# Patient Record
Sex: Male | Born: 1995 | Race: White | Hispanic: No | Marital: Single | Smoking: Never smoker
Health system: Southern US, Community
[De-identification: ages and names within clinical notes are randomized; demographics above are authoritative.]

## PROBLEM LIST (undated history)

## (undated) DIAGNOSIS — G4730 Sleep apnea, unspecified: Secondary | ICD-10-CM

## (undated) DIAGNOSIS — Q909 Down syndrome, unspecified: Secondary | ICD-10-CM

## (undated) DIAGNOSIS — R7303 Prediabetes: Secondary | ICD-10-CM

## (undated) DIAGNOSIS — J45909 Unspecified asthma, uncomplicated: Secondary | ICD-10-CM

## (undated) HISTORY — PX: TONSILLECTOMY: SUR1361

---
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
## 2016-01-31 ENCOUNTER — Ambulatory Visit: Payer: Medicaid Other

## 2016-02-02 ENCOUNTER — Ambulatory Visit
Admission: RE | Admit: 2016-02-02 | Discharge: 2016-02-02 | Disposition: A | Discharge status: Home / Self Care | Payer: Medicaid Other | Source: Ambulatory Visit | Attending: Pediatrics | Admitting: Pediatrics

## 2016-02-02 DIAGNOSIS — R231 Pallor: Secondary | ICD-10-CM | POA: Diagnosis present

## 2016-02-02 DIAGNOSIS — Q909 Down syndrome, unspecified: Secondary | ICD-10-CM | POA: Insufficient documentation

## 2016-02-02 DIAGNOSIS — R42 Dizziness and giddiness: Secondary | ICD-10-CM | POA: Diagnosis present

## 2016-12-14 ENCOUNTER — Encounter: Payer: Self-pay | Admitting: Emergency Medicine

## 2016-12-14 ENCOUNTER — Emergency Department: Payer: Medicaid Other

## 2016-12-14 ENCOUNTER — Emergency Department
Admission: EM | Admit: 2016-12-14 | Discharge: 2016-12-14 | Disposition: A | Discharge status: Home / Self Care | Payer: Medicaid Other | Attending: Emergency Medicine | Admitting: Emergency Medicine

## 2016-12-14 DIAGNOSIS — K5900 Constipation, unspecified: Secondary | ICD-10-CM | POA: Diagnosis not present

## 2016-12-14 DIAGNOSIS — R109 Unspecified abdominal pain: Secondary | ICD-10-CM

## 2016-12-14 DIAGNOSIS — J45909 Unspecified asthma, uncomplicated: Secondary | ICD-10-CM | POA: Diagnosis not present

## 2016-12-14 DIAGNOSIS — Z79899 Other long term (current) drug therapy: Secondary | ICD-10-CM | POA: Insufficient documentation

## 2016-12-14 HISTORY — DX: Unspecified asthma, uncomplicated: J45.909

## 2016-12-14 HISTORY — DX: Sleep apnea, unspecified: G47.30

## 2016-12-14 NOTE — ED Notes (Signed)
Pt tolerated half of the soap suds enema approx 250 cc

## 2016-12-14 NOTE — ED Notes (Signed)
Spoke with Dr. Pershing ProudSchaevitz in regards to patient presentation. No new orders at this time. Verbal order for patient to go to flex.

## 2016-12-14 NOTE — ED Notes (Signed)
First nurse note  [facility]  Constipation for 3 days

## 2016-12-14 NOTE — ED Provider Notes (Signed)
[facility] Emergency Department Provider Note  ____________________________________________   First MD Initiated Contact with Patient 12/14/16 1735     (approximate)  I have reviewed the triage vital signs and the nursing notes.   HISTORY  Chief Complaint Constipation    HPI Jeffrey Hodge is a 21 y.o. male who is accompanied here by his parents, he has Down syndrome and is a poor historian, the parents state that he keeps complaining of abdominal pain, states it started at the bellybutton and has moved to the lower abdomen, he has been constipated since Sunday, she states he will get a little bit of a bowel movement but it is watery and just on the toilet paper, she states he is crying when on the toilet, she states he has a high pain tolerance, she states he has not been constipated like this and they are concerned as he has a ventral hernia had a recent repair, they deny vomiting, fever, chest pain, or shortness of breath  Past Medical History:  Diagnosis Date  . Asthma   . Sleep apnea     Patient Active Problem List   Diagnosis Date Noted  . Difficulty hearing 05/20/2014  . Apnea, sleep 05/20/2014  . Disorder of vision 05/20/2014  . 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 Hydronephrosis 07/01/2013  . Allergic rhinitis 01/07/2013  . Adiposity 01/07/2013  . 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 TONSILLECTOMY      Prior to Admission medications   Medication Sig Start Date End Date Taking? Authorizing Provider  cetirizine (ZYRTEC) 1 MG/ML syrup  02/17/14   [provider]  DENTA 5000 PLUS 1.1 % CREA dental cream  05/06/14   [provider]  doxazosin (CARDURA) 1 MG tablet  04/01/14   [provider]  fluticasone Aleda Grana(FLONASE) 50 MCG/ACT nasal spray  04/26/14   [provider]  LORATADINE CHILDRENS  5 MG/5ML syrup  05/04/14   [provider]  prednisoLONE (ORAPRED) 15 MG/5ML solution  05/05/14   [provider]  QVAR 40 MCG/ACT inhaler  05/06/14   [provider]  triamcinolone (KENALOG) 0.025 % cream  04/10/14   [provider]  triamcinolone cream (KENALOG) 0.1 %  02/28/14   [provider]    Allergies Meningococcal vaccines  No family history on file.  Social History Social History   Tobacco Use  . Smoking status: Never Smoker  . Smokeless tobacco: Never Used  Substance Use Topics  . Alcohol use: No    Frequency: Never  . Drug use: No    Review of Systems  Constitutional: No fever/chills Eyes: No visual changes. ENT: No sore throat. Respiratory: Denies cough ABD: Positive for abdominal pain and constipation Genitourinary: Negative for dysuria. Musculoskeletal: Negative for back pain. Skin: Negative for rash.    ____________________________________________   PHYSICAL EXAM:  VITAL SIGNS: ED Triage Vitals [12/14/16 1702]  Enc Vitals Group     BP (!) 141/99     Pulse Rate 70     Resp 16     Temp 98.6 F (37 C)     Temp Source Oral     SpO2 98 %     Weight 250 lb (113.4 kg)     Height      Head Circumference      Peak Flow      Pain Score  Pain Loc      Pain Edu?      Excl. in GC?     Constitutional: Alert and oriented. Well appearing and in no acute distress. Eyes: Conjunctivae are normal.  Head: Atraumatic. Nose: No congestion/rhinnorhea. Mouth/Throat: Mucous membranes are moist.   Cardiovascular: Normal rate, regular rhythm.  Heart sounds are normal Respiratory: Normal respiratory effort.  No retractions, lungs are clear to auscultation ABD: Abdomen is soft, tender in the right lower quadrant, bowel sounds are decreased but present, there is no rebound tenderness GU: deferred Musculoskeletal: FROM all extremities, warm and well perfused Neurologic:  Normal speech and language.  Skin:  Skin is warm,  dry and intact. No rash noted. Psychiatric: Mood and affect are normal. Speech and behavior are normal.  ____________________________________________   LABS (all labs ordered are listed, but only abnormal results are displayed)  Labs Reviewed - No data to display ____________________________________________   ____________________________________________  RADIOLOGY  KUB x-ray was ordered  ____________________________________________   PROCEDURES  Procedure(s) performed: No      ____________________________________________   INITIAL IMPRESSION / ASSESSMENT AND PLAN / ED COURSE  Pertinent labs & imaging results that were available during my care of the patient were reviewed by me and considered in my medical decision making (see chart for details).  Patient is complaining of abdominal pain, states he has been constipated for several days, he can get small amount of bowel movement out but it is liquidy on the toilet paper, on exam the right lower quadrant is very tender, concerns of acute appendicitis, the patient was transferred over to the C pod due to concerns of appendicitis or impaction      ____________________________________________   FINAL CLINICAL IMPRESSION(S) / ED DIAGNOSES  Final diagnoses:  None      NEW MEDICATIONS STARTED DURING THIS VISIT:  This SmartLink is deprecated. Use AVSMEDLIST instead to display the medication list for a patient.   Note:  This document was prepared using Dragon voice recognition software and may include unintentional dictation errors.    Faythe GheeFisher, Simar Pothier W, PA-C 12/14/16 1827    Schaevitz, Myra Rudeavid Matthew, MD 12/14/16 [phone]

## 2016-12-14 NOTE — ED Notes (Signed)
Pt had emesis while attempting to defecate. Still no stool results.

## 2016-12-14 NOTE — ED Notes (Signed)
Pt anxious to left - declined vital signs

## 2016-12-14 NOTE — ED Notes (Signed)
Still no stool results. Ct of abd added.

## 2016-12-14 NOTE — ED Notes (Signed)
Enema performed by Darl PikesSusan, RN and assisted by this nurse.

## 2016-12-14 NOTE — ED Provider Notes (Signed)
[facility] Emergency Department Provider Note  Time seen: 8:03 PM  I have reviewed the triage vital signs and the nursing notes.  Patient was moved from flex care to CDU.  The patient has been seen, interviewed and evaluated by myself.   HISTORY  Chief Complaint Constipation    HPI Jeffrey Hodge is a 21 y.o. male with a past medical history of Down syndrome, asthma, presents to the emergency department for abdominal discomfort.  According to mom the patient has been intermittently complaining of abdominal discomfort over the past few days.  States he has not had a bowel movement in approximately 4 or 5 days, and normally has a bowel movement every day.  States at times he feels like he needs to have a bowel movement and will go to the bathroom but is not able to produce a bowel movement.  No reported dysuria.  No fever.  No vomiting or diarrhea. Past Medical History:  Diagnosis Date  . Asthma   . Sleep apnea     Patient Active Problem List   Diagnosis Date Noted  . Difficulty hearing 05/20/2014  . Apnea, sleep 05/20/2014  . Disorder of vision 05/20/2014  . 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 Hydronephrosis 07/01/2013  . Allergic rhinitis 01/07/2013  . Adiposity 01/07/2013  . 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 TONSILLECTOMY      Prior to Admission medications   Medication Sig Start Date End Date Taking? Authorizing Provider  cetirizine (ZYRTEC) 1 MG/ML syrup  02/17/14   [provider]  DENTA 5000 PLUS 1.1 % CREA dental cream  05/06/14   [provider]  doxazosin (CARDURA) 1 MG tablet  04/01/14   [provider]  fluticasone Aleda Grana(FLONASE) 50 MCG/ACT nasal spray  04/26/14   [provider]  LORATADINE CHILDRENS 5 MG/5ML syrup  05/04/14   [provider]  prednisoLONE (ORAPRED) 15 MG/5ML  solution  05/05/14   [provider]  QVAR 40 MCG/ACT inhaler  05/06/14   [provider]  triamcinolone (KENALOG) 0.025 % cream  04/10/14   [provider]  triamcinolone cream (KENALOG) 0.1 %  02/28/14   [provider]    Allergies  Allergen Reactions  . Meningococcal Vaccines Hives    No family history on file.  Social History Social History   Tobacco Use  . Smoking status: Never Smoker  . Smokeless tobacco: Never Used  Substance Use Topics  . Alcohol use: No    Frequency: Never  . Drug use: No    Review of Systems Constitutional: Negative for fever. Cardiovascular: Negative for chest pain. Respiratory: Negative for shortness of breath. Gastrointestinal: Positive for intermittent abdominal discomfort and constipation.  Denies any abdominal pain currently. Genitourinary: Negative for dysuria. All other ROS negative  ____________________________________________   PHYSICAL EXAM:  VITAL SIGNS: ED Triage Vitals [12/14/16 1702]  Enc Vitals Group     BP (!) 141/99     Pulse Rate 70     Resp 16     Temp 98.6 F (37 C)     Temp Source Oral     SpO2 98 %     Weight 250 lb (113.4 kg)     Height      Head Circumference      Peak Flow      Pain Score      Pain Loc  Pain Edu?      Excl. in GC?    Constitutional: Alert. Well appearing and in no distress. Eyes: Normal exam ENT   Head: Normocephalic and atraumatic.   Mouth/Throat: Mucous membranes are moist. Cardiovascular: Normal rate, regular rhythm. No murmur Respiratory: Normal respiratory effort without tachypnea nor retractions. Breath sounds are clear Gastrointestinal: Soft and nontender. No distention.  No reaction to abdominal palpation. Musculoskeletal: Nontender with normal range of motion in all extremities. Neurologic:  Normal speech and language. No gross focal neurologic deficits  Skin:  Skin is warm, dry and intact.  Psychiatric: Mood and affect are  normal.    RADIOLOGY  X-ray shows large volume of stool.  No obstructive gas pattern.  No perforation.  CT scan shows no acute abnormality.  ____________________________________________   INITIAL IMPRESSION / ASSESSMENT AND PLAN / ED COURSE  Pertinent labs & imaging results that were available during my care of the patient were reviewed by me and considered in my medical decision making (see chart for details).  Patient presents to the emergency department with reported intermittent abdominal discomfort and constipation per mom.  Currently the patient denies any abdominal pain.  Has a completely nontender exam.  X-ray shows a large volume of colonic stool but no obstructive gas pattern.  Differential would include small bowel obstruction, constipation, fecal impaction, less likely appendicitis.  Overall the patient appears very well.  Vitals are normal.  Normal abdominal exam.  Given the x-ray showing a large volume of stool we will attempt an enema for symptom relief.  If the patient is able to have a bowel movement we will likely discharge home, if not the patient may require further workup such as CT imaging.  No relief after first half of enema.  I performed a digital rectal exam the patient does have hard stool in the rectal vault I was able to break up some of the hard stool, but it is difficult given habitus and patient cooperation.  We will attempt the second half of the enema continue to closely monitor for relief.  No significant bowel movement after the second half of the enema.  Parents state this is the patient's first time he has ever been constipated in his life and is extremely abnormal for the patient.  Denies any vomiting.  Denies any fever.  They are agreeable to stay for a noncontrasted CT scan to help rule out obstructive process.  Otherwise I discussed a home MiraLAX bowel prep.  CT scan is read as largely negative.  There is one area of bowel thickening that I was  concerned about when I reviewed the CT I discussed this with the radiologist who states it is most likely just decompressed bowel.  Patient continues to appear extremely well in the emergency department.  No signs of obstruction on the CT scan.  I discussed using MiraLAX at home for symptom/constipation relief.  I also discussed return precautions for any complaints of abdominal pain or development of fever.  They are agreeable to this plan of care.  ____________________________________________   FINAL CLINICAL IMPRESSION(S) / ED DIAGNOSES  Constipation Abdominal pain    Minna AntisPaduchowski, Alanie Syler, MD 12/14/16 2309

## 2016-12-14 NOTE — ED Triage Notes (Signed)
Patient presents to ED via POV from home with c/o constipation since Sunday. Patient reports umbilical pain. Patient is developmentally delayed, history is obtained from family.

## 2016-12-14 NOTE — ED Notes (Signed)
Dr and nurse in with pt - dr attempting to disimpact, was able to break up some stool but unable to remove any. Rest of soap suds administered.

## 2019-03-22 ENCOUNTER — Ambulatory Visit: Payer: Medicaid Other

## 2020-06-20 ENCOUNTER — Emergency Department (HOSPITAL_COMMUNITY): Payer: Medicaid Other

## 2020-06-20 ENCOUNTER — Encounter (HOSPITAL_COMMUNITY): Payer: Self-pay

## 2020-06-20 ENCOUNTER — Encounter: Payer: Self-pay | Admitting: Emergency Medicine

## 2020-06-20 ENCOUNTER — Other Ambulatory Visit: Payer: Self-pay

## 2020-06-20 ENCOUNTER — Observation Stay (HOSPITAL_COMMUNITY)
Admission: EM | Admit: 2020-06-20 | Discharge: 2020-06-21 | Disposition: A | Discharge status: Home / Self Care | Payer: Medicaid Other | Attending: Orthopedic Surgery | Admitting: Orthopedic Surgery

## 2020-06-20 DIAGNOSIS — W540XXA Bitten by dog, initial encounter: Secondary | ICD-10-CM | POA: Diagnosis not present

## 2020-06-20 DIAGNOSIS — S60922A Unspecified superficial injury of left hand, initial encounter: Secondary | ICD-10-CM | POA: Diagnosis present

## 2020-06-20 DIAGNOSIS — J45909 Unspecified asthma, uncomplicated: Secondary | ICD-10-CM | POA: Diagnosis not present

## 2020-06-20 DIAGNOSIS — Z20822 Contact with and (suspected) exposure to covid-19: Secondary | ICD-10-CM | POA: Insufficient documentation

## 2020-06-20 DIAGNOSIS — S61452A Open bite of left hand, initial encounter: Principal | ICD-10-CM | POA: Insufficient documentation

## 2020-06-20 DIAGNOSIS — Q909 Down syndrome, unspecified: Secondary | ICD-10-CM | POA: Insufficient documentation

## 2020-06-20 DIAGNOSIS — S61259A Open bite of unspecified finger without damage to nail, initial encounter: Secondary | ICD-10-CM | POA: Diagnosis present

## 2020-06-20 DIAGNOSIS — S61412A Laceration without foreign body of left hand, initial encounter: Secondary | ICD-10-CM

## 2020-06-20 HISTORY — DX: Down syndrome, unspecified: Q90.9

## 2020-06-20 MED ORDER — FENTANYL CITRATE (PF) 100 MCG/2ML IJ SOLN: 50.0000 ug | Freq: Once | INTRAMUSCULAR | Status: DC

## 2020-06-20 MED ORDER — FENTANYL CITRATE (PF) 100 MCG/2ML IJ SOLN
25.0000 ug | Freq: Once | INTRAMUSCULAR | Status: AC
  Administered 2020-06-20: 25 ug via INTRAVENOUS
  Filled 2020-06-20: qty 2

## 2020-06-20 MED ORDER — CIPROFLOXACIN IN D5W 400 MG/200ML IV SOLN
400.0000 mg | Freq: Once | INTRAVENOUS | Status: DC
  Filled 2020-06-20: qty 200

## 2020-06-20 MED ORDER — LORAZEPAM 2 MG/ML IJ SOLN: 0.5000 mg | Freq: Once | INTRAMUSCULAR | Status: DC

## 2020-06-20 NOTE — ED Triage Notes (Signed)
Pt bib ems from home c/o dog bite. Pt was given IM of fentanyl. Ems showed a picture of left hand 4th digit with skin removed.   HR 96 Room Air 98% BP 130/76 RR 16

## 2020-06-20 NOTE — ED Notes (Signed)
Pt ambulated to the bathroom with mom.

## 2020-06-20 NOTE — ED Provider Notes (Signed)
[facility] EMERGENCY DEPARTMENT Provider Note   CSN: [id number] Arrival date & time: 06/20/20  2234     History Chief Complaint - dog bite   Jeffrey Hodge is a 25 y.o. male.  The history is provided by the patient and a parent. The history is limited by a developmental delay.  Animal Bite Contact animal:  Dog Location:  Hand Hand injury location:  L hand Pain details:    Quality:  Aching   Severity:  Severe   Timing:  Constant   Progression:  Worsening Animal's rabies vaccination status:  Up to date Animal in possession: yes   Tetanus status:  Up to date Relieved by:  None tried Worsened by:  Nothing Patient history of Down syndrome presents with dog bite.  Patient was bitten in the left hand by his sisters pitbull.  He has a significant injury to his left ring finger and hand.  No other injuries reported.  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  Social History   Tobacco Use   Smoking status: Never   Smokeless tobacco: Never  Substance Use Topics   Alcohol use: No   Drug use: No    Home Medications Prior to Admission medications   Medication Sig Start Date End Date Taking? Authorizing Provider  cetirizine (ZYRTEC) 1 MG/ML syrup  02/17/14   [provider]  DENTA 5000 PLUS 1.1 % CREA dental cream  05/06/14   [provider]   doxazosin (CARDURA) 1 MG tablet  04/01/14   [provider]  fluticasone Aleda Grana) 50 MCG/ACT nasal spray  04/26/14   [provider]  LORATADINE CHILDRENS 5 MG/5ML syrup  05/04/14   [provider]  prednisoLONE (ORAPRED) 15 MG/5ML solution  05/05/14   [provider]  QVAR 40 MCG/ACT inhaler  05/06/14   [provider]  triamcinolone (KENALOG) 0.025 % cream  04/10/14   [provider]  triamcinolone cream (KENALOG) 0.1 %  02/28/14   [provider]    Allergies    Meningococcal and conjugated vaccines  Review of Systems   Review of Systems  Unable to perform ROS: Other  LEVEL 5 - DEVELOPMENTAL DELAY Physical Exam Updated Vital Signs BP (!) 151/91 (BP Location: Right Arm)   Pulse 88   Temp 98.4 F (36.9 C) (Axillary)   Resp 16   Ht 1.524 m (5')   Wt 129.3 kg   SpO2 97%   BMI 55.66 kg/m   Physical Exam CONSTITUTIONAL: well nourished, anxious HEAD: Normocephalic/atraumatic EYES: EOMI ENMT: Mucous membranes moist NECK: supple no meningeal signs SPINE/BACK:entire spine nontender CV: S1/S2 noted, no murmurs/rubs/gallops noted LUNGS: Lungs are clear to auscultation bilaterally, no apparent distress ABDOMEN: soft, nontender NEURO: Pt is awake/alert/appropriate, moves all extremitiesx4.   EXTREMITIES: pulses normal/equal, large soft tissue defect noted to  the dorsal aspect of left ring finger and hand.  See photo No other wounds are noted proximal to the wrist. All other extremities/joints palpated/ranged and nontender SKIN: warm, color normal PSYCH: Anxious       ED Results / Procedures / Treatments   Labs (all labs ordered are listed, but only abnormal results are displayed) Labs Reviewed - No data to display  EKG None  Radiology DG Hand 2 View Left  Result Date: 06/20/2020 CLINICAL DATA:  Dog bite. EXAM: LEFT HAND - 2 VIEW COMPARISON:  None. FINDINGS: There is no evidence of fracture or dislocation. There  is no evidence of arthropathy or other focal bone abnormality. Soft tissue edema noted about the mid metacarpal phalangeal joints. No radiopaque foreign body. IMPRESSION: Soft tissue edema. No radiopaque foreign body or osseous abnormality. Electronically Signed   By: Narda Rutherford M.D.   On: 06/20/2020 23:25    Procedures Procedures   Medications Ordered in ED Medications  ciprofloxacin (CIPRO) IVPB 400 mg (has no administration in time range)  fentaNYL (SUBLIMAZE) injection 50 mcg (has no administration in time range)    ED Course  I have reviewed the triage vital signs and the nursing notes.  Pertinent labs & imaging results that were available during my care of the patient were reviewed by me and considered in my medical decision making (see chart for details).    MDM Rules/Calculators/A&P                         11:49 PM Patient presents after dog bite to his left hand and ring finger.  Patient has significant injury to the hand.  There is no other signs of acute injury. Due to significant injury, underlying comorbidities, he may need to have this repaired in the operating room.  Discussed with Dr. Eulah Pont of orthopedics who will see the patient.  Final Clinical Impression(s) / ED Diagnoses Final diagnoses:  Dog bite, initial encounter  Laceration of left hand, foreign body presence unspecified, initial encounter    Rx / DC Orders ED Discharge Orders     None        Zadie Rhine, MD 06/20/20 2349

## 2020-06-21 ENCOUNTER — Emergency Department (HOSPITAL_COMMUNITY): Payer: Medicaid Other | Admitting: Anesthesiology

## 2020-06-21 ENCOUNTER — Encounter (HOSPITAL_COMMUNITY)
Admission: EM | Disposition: A | Discharge status: Home / Self Care | Payer: Self-pay | Source: Home / Self Care | Attending: Emergency Medicine

## 2020-06-21 DIAGNOSIS — S61259A Open bite of unspecified finger without damage to nail, initial encounter: Secondary | ICD-10-CM | POA: Diagnosis present

## 2020-06-21 DIAGNOSIS — J45909 Unspecified asthma, uncomplicated: Secondary | ICD-10-CM | POA: Diagnosis not present

## 2020-06-21 DIAGNOSIS — S61452A Open bite of left hand, initial encounter: Secondary | ICD-10-CM | POA: Diagnosis not present

## 2020-06-21 DIAGNOSIS — W540XXA Bitten by dog, initial encounter: Secondary | ICD-10-CM | POA: Diagnosis present

## 2020-06-21 DIAGNOSIS — Z20822 Contact with and (suspected) exposure to covid-19: Secondary | ICD-10-CM | POA: Diagnosis not present

## 2020-06-21 HISTORY — PX: I & D EXTREMITY: SHX5045

## 2020-06-21 LAB — CBC
HCT: 38.5 % — ABNORMAL LOW (ref 39.0–52.0)
Hemoglobin: 12.5 g/dL — ABNORMAL LOW (ref 13.0–17.0)
MCH: 29.3 pg (ref 26.0–34.0)
MCHC: 32.5 g/dL (ref 30.0–36.0)
MCV: 90.4 fL (ref 80.0–100.0)
Platelets: 251 10*3/uL (ref 150–400)
RBC: 4.26 MIL/uL (ref 4.22–5.81)
RDW: 14.6 % (ref 11.5–15.5)
WBC: 11.3 10*3/uL — ABNORMAL HIGH (ref 4.0–10.5)
nRBC: 0 % (ref 0.0–0.2)

## 2020-06-21 LAB — RESP PANEL BY RT-PCR (FLU A&B, COVID) ARPGX2
Influenza A by PCR: NEGATIVE
Influenza B by PCR: NEGATIVE
SARS Coronavirus 2 by RT PCR: NEGATIVE

## 2020-06-21 SURGERY — IRRIGATION AND DEBRIDEMENT EXTREMITY
Anesthesia: General | Site: Hand | Laterality: Left

## 2020-06-21 MED ORDER — SODIUM CHLORIDE 0.9 % IV SOLN
3.0000 g | Freq: Once | INTRAVENOUS | Status: AC
  Administered 2020-06-21: 3 g via INTRAVENOUS
  Filled 2020-06-21: qty 3

## 2020-06-21 MED ORDER — MAGNESIUM CITRATE PO SOLN: 1.0000 | Freq: Once | ORAL | Status: DC | PRN

## 2020-06-21 MED ORDER — 0.9 % SODIUM CHLORIDE (POUR BTL) OPTIME
TOPICAL | Status: DC | PRN
  Administered 2020-06-21: 1000 mL

## 2020-06-21 MED ORDER — ROCURONIUM BROMIDE 10 MG/ML (PF) SYRINGE
PREFILLED_SYRINGE | INTRAVENOUS | Status: DC | PRN
  Administered 2020-06-21: 40 mg via INTRAVENOUS

## 2020-06-21 MED ORDER — AMOXICILLIN-POT CLAVULANATE 875-125 MG PO TABS: 1.0000 | ORAL_TABLET | Freq: Two times a day (BID) | ORAL | 0 refills | Status: AC

## 2020-06-21 MED ORDER — FENTANYL CITRATE (PF) 250 MCG/5ML IJ SOLN
INTRAMUSCULAR | Status: AC
  Filled 2020-06-21: qty 5

## 2020-06-21 MED ORDER — HYDROCODONE-ACETAMINOPHEN 5-325 MG PO TABS
1.0000 | ORAL_TABLET | ORAL | Status: DC | PRN
  Administered 2020-06-21: 1 via ORAL
  Filled 2020-06-21: qty 1

## 2020-06-21 MED ORDER — PROPOFOL 10 MG/ML IV BOLUS
INTRAVENOUS | Status: DC | PRN
  Administered 2020-06-21: 200 mg via INTRAVENOUS

## 2020-06-21 MED ORDER — PHENYLEPHRINE 40 MCG/ML (10ML) SYRINGE FOR IV PUSH (FOR BLOOD PRESSURE SUPPORT)
PREFILLED_SYRINGE | INTRAVENOUS | Status: DC | PRN
  Administered 2020-06-21: 120 ug via INTRAVENOUS
  Administered 2020-06-21: 160 ug via INTRAVENOUS
  Administered 2020-06-21: 120 ug via INTRAVENOUS

## 2020-06-21 MED ORDER — ONDANSETRON HCL 4 MG/2ML IJ SOLN
INTRAMUSCULAR | Status: AC
  Filled 2020-06-21: qty 2

## 2020-06-21 MED ORDER — ROCURONIUM BROMIDE 10 MG/ML (PF) SYRINGE
PREFILLED_SYRINGE | INTRAVENOUS | Status: AC
  Filled 2020-06-21: qty 10

## 2020-06-21 MED ORDER — DEXAMETHASONE SODIUM PHOSPHATE 10 MG/ML IJ SOLN
INTRAMUSCULAR | Status: DC | PRN
  Administered 2020-06-21: 10 mg via INTRAVENOUS

## 2020-06-21 MED ORDER — PHENYLEPHRINE HCL-NACL 10-0.9 MG/250ML-% IV SOLN
INTRAVENOUS | Status: DC | PRN
  Administered 2020-06-21: 40 ug/min via INTRAVENOUS

## 2020-06-21 MED ORDER — DEXAMETHASONE SODIUM PHOSPHATE 10 MG/ML IJ SOLN
INTRAMUSCULAR | Status: AC
  Filled 2020-06-21: qty 1

## 2020-06-21 MED ORDER — FAMOTIDINE 20 MG PO TABS: 20.0000 mg | ORAL_TABLET | Freq: Two times a day (BID) | ORAL | Status: DC | PRN

## 2020-06-21 MED ORDER — ONDANSETRON HCL 4 MG/2ML IJ SOLN: 4.0000 mg | Freq: Four times a day (QID) | INTRAMUSCULAR | Status: DC | PRN

## 2020-06-21 MED ORDER — POLYETHYLENE GLYCOL 3350 17 G PO PACK: 17.0000 g | PACK | Freq: Every day | ORAL | Status: DC | PRN

## 2020-06-21 MED ORDER — PROPOFOL 10 MG/ML IV BOLUS
INTRAVENOUS | Status: AC
  Filled 2020-06-21: qty 20

## 2020-06-21 MED ORDER — MORPHINE SULFATE (PF) 2 MG/ML IV SOLN
0.5000 mg | INTRAVENOUS | Status: DC | PRN
  Filled 2020-06-21: qty 1

## 2020-06-21 MED ORDER — MIDAZOLAM HCL 2 MG/2ML IJ SOLN
INTRAMUSCULAR | Status: AC
  Filled 2020-06-21: qty 2

## 2020-06-21 MED ORDER — SUGAMMADEX SODIUM 200 MG/2ML IV SOLN
INTRAVENOUS | Status: DC | PRN
  Administered 2020-06-21: 200 mg via INTRAVENOUS

## 2020-06-21 MED ORDER — ONDANSETRON HCL 4 MG PO TABS: 4.0000 mg | ORAL_TABLET | Freq: Four times a day (QID) | ORAL | Status: DC | PRN

## 2020-06-21 MED ORDER — FENTANYL CITRATE (PF) 250 MCG/5ML IJ SOLN
INTRAMUSCULAR | Status: DC | PRN
  Administered 2020-06-21 (×5): 50 ug via INTRAVENOUS

## 2020-06-21 MED ORDER — DOCUSATE SODIUM 100 MG PO CAPS
100.0000 mg | ORAL_CAPSULE | Freq: Two times a day (BID) | ORAL | Status: DC
  Administered 2020-06-21: 100 mg via ORAL
  Filled 2020-06-21: qty 1

## 2020-06-21 MED ORDER — DIPHENHYDRAMINE HCL 25 MG PO CAPS: 25.0000 mg | ORAL_CAPSULE | Freq: Four times a day (QID) | ORAL | Status: DC | PRN

## 2020-06-21 MED ORDER — POTASSIUM CHLORIDE IN NACL 20-0.9 MEQ/L-% IV SOLN
INTRAVENOUS | Status: DC
  Filled 2020-06-21: qty 1000

## 2020-06-21 MED ORDER — DEXTROSE 5 % IV SOLN
INTRAVENOUS | Status: DC | PRN
  Administered 2020-06-21: 3 g via INTRAVENOUS

## 2020-06-21 MED ORDER — LIDOCAINE HCL (PF) 2 % IJ SOLN
INTRAMUSCULAR | Status: AC
  Filled 2020-06-21: qty 5

## 2020-06-21 MED ORDER — BISACODYL 10 MG RE SUPP: 10.0000 mg | Freq: Every day | RECTAL | Status: DC | PRN

## 2020-06-21 MED ORDER — SUCCINYLCHOLINE CHLORIDE 200 MG/10ML IV SOSY
PREFILLED_SYRINGE | INTRAVENOUS | Status: DC | PRN
  Administered 2020-06-21: 160 mg via INTRAVENOUS

## 2020-06-21 MED ORDER — OXYCODONE HCL 5 MG PO TABS: 5.0000 mg | ORAL_TABLET | Freq: Once | ORAL | Status: DC | PRN

## 2020-06-21 MED ORDER — OXYCODONE HCL 5 MG/5ML PO SOLN: 5.0000 mg | Freq: Once | ORAL | Status: DC | PRN

## 2020-06-21 MED ORDER — ACETAMINOPHEN 325 MG PO TABS: 325.0000 mg | ORAL_TABLET | Freq: Four times a day (QID) | ORAL | Status: DC | PRN

## 2020-06-21 MED ORDER — HYDROCODONE-ACETAMINOPHEN 7.5-325 MG PO TABS: 1.0000 | ORAL_TABLET | ORAL | Status: DC | PRN

## 2020-06-21 MED ORDER — ACETAMINOPHEN 500 MG PO TABS: 500.0000 mg | ORAL_TABLET | Freq: Four times a day (QID) | ORAL | Status: DC

## 2020-06-21 MED ORDER — HYDROCODONE-ACETAMINOPHEN 5-325 MG PO TABS: 1.0000 | ORAL_TABLET | Freq: Four times a day (QID) | ORAL | 0 refills | Status: DC | PRN

## 2020-06-21 MED ORDER — LIDOCAINE HCL (CARDIAC) PF 100 MG/5ML IV SOSY
PREFILLED_SYRINGE | INTRAVENOUS | Status: DC | PRN
  Administered 2020-06-21: 60 mg via INTRAVENOUS

## 2020-06-21 MED ORDER — ONDANSETRON HCL 4 MG/2ML IJ SOLN: 4.0000 mg | Freq: Once | INTRAMUSCULAR | Status: DC | PRN

## 2020-06-21 MED ORDER — SODIUM CHLORIDE 0.9 % IR SOLN
Status: DC | PRN
  Administered 2020-06-21: 3000 mL

## 2020-06-21 MED ORDER — MIDAZOLAM HCL 2 MG/2ML IJ SOLN
INTRAMUSCULAR | Status: DC | PRN
  Administered 2020-06-21: 2 mg via INTRAVENOUS

## 2020-06-21 MED ORDER — AMOXICILLIN-POT CLAVULANATE 875-125 MG PO TABS
1.0000 | ORAL_TABLET | Freq: Two times a day (BID) | ORAL | Status: DC
  Administered 2020-06-21: 1 via ORAL
  Filled 2020-06-21: qty 1

## 2020-06-21 MED ORDER — FENTANYL CITRATE (PF) 100 MCG/2ML IJ SOLN: 25.0000 ug | Freq: Once | INTRAMUSCULAR | Status: DC

## 2020-06-21 MED ORDER — ONDANSETRON HCL 4 MG PO TABS: 4.0000 mg | ORAL_TABLET | Freq: Three times a day (TID) | ORAL | 0 refills | Status: DC | PRN

## 2020-06-21 MED ORDER — LACTATED RINGERS IV SOLN: INTRAVENOUS | Status: DC | PRN

## 2020-06-21 MED ORDER — ONDANSETRON HCL 4 MG/2ML IJ SOLN
INTRAMUSCULAR | Status: DC | PRN
  Administered 2020-06-21: 4 mg via INTRAVENOUS

## 2020-06-21 MED ORDER — PHENYLEPHRINE 40 MCG/ML (10ML) SYRINGE FOR IV PUSH (FOR BLOOD PRESSURE SUPPORT)
PREFILLED_SYRINGE | INTRAVENOUS | Status: AC
  Filled 2020-06-21: qty 10

## 2020-06-21 MED ORDER — FENTANYL CITRATE (PF) 100 MCG/2ML IJ SOLN: 25.0000 ug | INTRAMUSCULAR | Status: DC | PRN

## 2020-06-21 MED ORDER — CEFAZOLIN SODIUM 1 G IJ SOLR
INTRAMUSCULAR | Status: AC
  Filled 2020-06-21: qty 30

## 2020-06-21 MED ORDER — SUCCINYLCHOLINE CHLORIDE 200 MG/10ML IV SOSY
PREFILLED_SYRINGE | INTRAVENOUS | Status: AC
  Filled 2020-06-21: qty 10

## 2020-06-21 SURGICAL SUPPLY — 44 items
BNDG CMPR 9X4 STRL LF SNTH (GAUZE/BANDAGES/DRESSINGS) ×1
BNDG ELASTIC 3X5.8 VLCR STR LF (GAUZE/BANDAGES/DRESSINGS) ×1 IMPLANT
BNDG ELASTIC 4X5.8 VLCR STR LF (GAUZE/BANDAGES/DRESSINGS) ×2 IMPLANT
BNDG ELASTIC 6X5.8 VLCR STR LF (GAUZE/BANDAGES/DRESSINGS) ×2 IMPLANT
BNDG ESMARK 4X9 LF (GAUZE/BANDAGES/DRESSINGS) ×1 IMPLANT
BNDG GAUZE ELAST 4 BULKY (GAUZE/BANDAGES/DRESSINGS) ×2 IMPLANT
CNTNR URN SCR LID CUP LEK RST (MISCELLANEOUS) IMPLANT
CONT SPEC 4OZ STRL OR WHT (MISCELLANEOUS)
COVER SURGICAL LIGHT HANDLE (MISCELLANEOUS) ×2 IMPLANT
DRAPE SURG 17X23 STRL (DRAPES) ×1 IMPLANT
DRSG ADAPTIC 3X8 NADH LF (GAUZE/BANDAGES/DRESSINGS) ×1 IMPLANT
DURAPREP 26ML APPLICATOR (WOUND CARE) ×2 IMPLANT
ELECT REM PT RETURN 9FT ADLT (ELECTROSURGICAL) ×2
ELECTRODE REM PT RTRN 9FT ADLT (ELECTROSURGICAL) IMPLANT
FACESHIELD WRAPAROUND (MASK) ×2 IMPLANT
FACESHIELD WRAPAROUND OR TEAM (MASK) ×1 IMPLANT
GLOVE BIOGEL PI IND STRL 7.5 (GLOVE) ×1 IMPLANT
GLOVE BIOGEL PI INDICATOR 7.5 (GLOVE) ×1
GLOVE SRG 8 PF TXTR STRL LF DI (GLOVE) ×1 IMPLANT
GLOVE SURG UNDER POLY LF SZ8 (GLOVE) ×2
GOWN STRL REUS W/ TWL LRG LVL3 (GOWN DISPOSABLE) ×2 IMPLANT
GOWN STRL REUS W/ TWL XL LVL3 (GOWN DISPOSABLE) ×2 IMPLANT
GOWN STRL REUS W/TWL LRG LVL3 (GOWN DISPOSABLE) ×4
GOWN STRL REUS W/TWL XL LVL3 (GOWN DISPOSABLE) ×2
KIT BASIN OR (CUSTOM PROCEDURE TRAY) ×2 IMPLANT
KIT TURNOVER KIT B (KITS) ×2 IMPLANT
MANIFOLD NEPTUNE II (INSTRUMENTS) ×2 IMPLANT
NDL HYPO 25GX1X1/2 BEV (NEEDLE) IMPLANT
NEEDLE HYPO 25GX1X1/2 BEV (NEEDLE) IMPLANT
NS IRRIG 1000ML POUR BTL (IV SOLUTION) ×2 IMPLANT
PACK ORTHO EXTREMITY (CUSTOM PROCEDURE TRAY) ×2 IMPLANT
PAD ARMBOARD 7.5X6 YLW CONV (MISCELLANEOUS) ×4 IMPLANT
PADDING CAST ABS 4INX4YD NS (CAST SUPPLIES) ×1
PADDING CAST ABS COTTON 4X4 ST (CAST SUPPLIES) IMPLANT
SET CYSTO W/LG BORE CLAMP LF (SET/KITS/TRAYS/PACK) ×1 IMPLANT
SOL PREP POV-IOD 4OZ 10% (MISCELLANEOUS) ×1 IMPLANT
SOL PREP PROV IODINE SCRUB 4OZ (MISCELLANEOUS) ×1 IMPLANT
SUT CHROMIC 3 0 SH 27 (SUTURE) ×1 IMPLANT
SUT ETHILON 3 0 PS 1 (SUTURE) ×2 IMPLANT
TOWEL GREEN STERILE (TOWEL DISPOSABLE) ×2 IMPLANT
TOWEL GREEN STERILE FF (TOWEL DISPOSABLE) ×2 IMPLANT
TUBE CONNECTING 12X1/4 (SUCTIONS) ×2 IMPLANT
UNDERPAD 30X36 HEAVY ABSORB (UNDERPADS AND DIAPERS) ×2 IMPLANT
YANKAUER SUCT BULB TIP NO VENT (SUCTIONS) ×2 IMPLANT

## 2020-06-21 NOTE — Anesthesia Preprocedure Evaluation (Addendum)
Anesthesia Evaluation  Patient identified by MRN, date of birth, ID band Patient awake    Reviewed: Allergy & Precautions, NPO status , Patient's Chart, lab work & pertinent test results  History of Anesthesia Complications Negative for: history of anesthetic complications  Airway Mallampati: IV  TM Distance: <3 FB Neck ROM: Full    Dental  (+) Dental Advisory Given   Pulmonary asthma , sleep apnea and Continuous Positive Airway Pressure Ventilation ,    Pulmonary exam normal        Cardiovascular negative cardio ROS Normal cardiovascular exam     Neuro/Psych  Down syndrome  negative psych ROS   GI/Hepatic negative GI ROS, Neg liver ROS,   Endo/Other  Morbid obesity  Renal/GU negative Renal ROS     Musculoskeletal negative musculoskeletal ROS (+)   Abdominal   Peds  Hematology negative hematology ROS (+)   Anesthesia Other Findings   Reproductive/Obstetrics                            Anesthesia Physical Anesthesia Plan  ASA: 3  Anesthesia Plan: General   Post-op Pain Management:    Induction: Intravenous  PONV Risk Score and Plan: 2 and Treatment may vary due to age or medical condition, Ondansetron, Dexamethasone and Midazolam  Airway Management Planned: Oral ETT and Video Laryngoscope Planned  Additional Equipment: None  Intra-op Plan:   Post-operative Plan: Extubation in OR  Informed Consent: I have reviewed the patients History and Physical, chart, labs and discussed the procedure including the risks, benefits and alternatives for the proposed anesthesia with the patient or authorized representative who has indicated his/her understanding and acceptance.     Dental advisory given and Consent reviewed with POA  Plan Discussed with: CRNA and Anesthesiologist  Anesthesia Plan Comments:        Anesthesia Quick Evaluation

## 2020-06-21 NOTE — H&P (Signed)
ORTHOPAEDIC CONSULTATION  REQUESTING PHYSICIAN: Ripley Fraise, MD  Time called  Time arrived   Chief Complaint: Left hand injury  HPI: Jeffrey Hodge is a 25 y.o. male who complains of  a dog bite to the left hand and ring finger  Past Medical History:  Diagnosis Date   Asthma    Down syndrome    Sleep apnea    Past Surgical History:  Procedure Laterality Date   TONSILLECTOMY     Social History   Socioeconomic History   Marital status: Single    Spouse name: Not on file   Number of children: Not on file   Years of education: Not on file   Highest education level: Not on file  Occupational History   Not on file  Tobacco Use   Smoking status: Never   Smokeless tobacco: Never  Substance and Sexual Activity   Alcohol use: No   Drug use: No   Sexual activity: Not on file  Other Topics Concern   Not on file  Social History Narrative   Not on file   Social Determinants of Health   Financial Resource Strain: Not on file  Food Insecurity: Not on file  Transportation Needs: Not on file  Physical Activity: Not on file  Stress: Not on file  Social Connections: Not on file   No family history on file. Allergies  Allergen Reactions   Meningococcal And Conjugated Vaccines Hives   Prior to Admission medications   Medication Sig Start Date End Date Taking? Authorizing Provider  cetirizine (ZYRTEC) 1 MG/ML syrup  02/17/14   [provider]  DENTA 5000 PLUS 1.1 % CREA dental cream  05/06/14   [provider]  doxazosin (CARDURA) 1 MG tablet  04/01/14   [provider]  fluticasone Asencion Islam) 50 MCG/ACT nasal spray  04/26/14   [provider]  LORATADINE CHILDRENS 5 MG/5ML syrup  05/04/14   [provider]  prednisoLONE (ORAPRED) 15 MG/5ML solution  05/05/14   [provider]  QVAR 40 MCG/ACT inhaler  05/06/14   [provider]  triamcinolone (KENALOG) 0.025 % cream  04/10/14   [provider]   triamcinolone cream (KENALOG) 0.1 %  02/28/14   [provider]   DG Hand 2 View Left  Result Date: 06/20/2020 CLINICAL DATA:  Dog bite. EXAM: LEFT HAND - 2 VIEW COMPARISON:  None. FINDINGS: There is no evidence of fracture or dislocation. There is no evidence of arthropathy or other focal bone abnormality. Soft tissue edema noted about the mid metacarpal phalangeal joints. No radiopaque foreign body. IMPRESSION: Soft tissue edema. No radiopaque foreign body or osseous abnormality. Electronically Signed   By: Keith Rake M.D.   On: 06/20/2020 23:25    Positive ROS: All other systems have been reviewed and were otherwise negative with the exception of those mentioned in the HPI and as above.  Labs cbc No results for input(s): WBC, HGB, HCT, PLT in the last 72 hours.  Labs inflam No results for input(s): CRP in the last 72 hours.  Invalid input(s): ESR  Labs coag No results for input(s): INR, PTT in the last 72 hours.  Invalid input(s): PT  No results for input(s): NA, K, CL, CO2, GLUCOSE, BUN, CREATININE, CALCIUM in the last 72 hours.  Physical Exam: Vitals:   06/20/20 2240  BP: (!) 151/91  Pulse: 88  Resp: 16  Temp: 98.4 F (36.9 C)  SpO2: 97%   General: Alert, no  acute distress Cardiovascular: No pedal edema Respiratory: No cyanosis, no use of accessory musculature GI: No organomegaly, abdomen is soft and non-tender Skin: No lesions in the area of chief complaint other than those listed below in MSK exam.  Neurologic: Sensation intact distally save for the below mentioned MSK exam Psychiatric: Patient is competent for consent with normal mood and affect Lymphatic: No axillary or cervical lymphadenopathy  MUSCULOSKELETAL:  The dorsum of the left fingers been partially degloved some exposed extensor tendon.  Volarly injury Goes into the palm but finger appears mostly spared. Neurovascular exam is somewhat limited due to pain he does have diminished sensation  to the tip of the ring finger.  Other extremities are atraumatic with painless ROM and NVI.  Assessment: Dog bite to the left hand multiple lacerations degloving of ring finger  Plan: Plan to go to the operating room for thorough irrigation debridement exploration repair of wound Augmentin and Ancef    Renette Butters, MD    06/21/2020 12:09 AM

## 2020-06-21 NOTE — Anesthesia Procedure Notes (Signed)
Procedure Name: Intubation Date/Time: 06/21/2020 1:28 AM Performed by: Tressia Miners, CRNA Pre-anesthesia Checklist: Patient identified, Emergency Drugs available, Suction available and Patient being monitored Patient Re-evaluated:Patient Re-evaluated prior to induction Oxygen Delivery Method: Circle System Utilized Preoxygenation: Pre-oxygenation with 100% oxygen Induction Type: IV induction, Rapid sequence and Cricoid Pressure applied Ventilation: Unable to mask ventilate Laryngoscope Size: Glidescope and 4 Grade View: Grade I Tube type: Oral Tube size: 7.5 mm Number of attempts: 1 Airway Equipment and Method: Stylet Placement Confirmation: ETT inserted through vocal cords under direct vision, positive ETCO2 and breath sounds checked- equal and bilateral Secured at: 23 cm Tube secured with: Tape Dental Injury: Teeth and Oropharynx as per pre-operative assessment

## 2020-06-21 NOTE — Discharge Instructions (Signed)
Diet: As you were doing prior to hospitalization   Shower:  May shower but keep the wounds dry, use an occlusive plastic wrap, NO SOAKING IN TUB. you have a splint on, leave the splint in place and keep the splint dry with a plastic bag.  Dressing: just leave the splint in place and we will change your bandages during your first follow-up appointment. we will plan to remove your stitches in about 2 weeks in the office.    Activity:  Increase activity slowly as tolerated, but follow the weight bearing instructions below.  The rules on driving is that you can not be taking narcotics while you drive, and you must feel in control of the vehicle.    Weight Bearing:   No bearing weight with left hand.   To prevent constipation: you may use a stool softener such as -  Colace (over the counter) 100 mg by mouth twice a day  Drink plenty of fluids (prune juice may be helpful) and high fiber foods Miralax (over the counter) for constipation as needed.    Itching:  If you experience itching with your medications, try taking only a single pain pill, or even half a pain pill at a time.  You may take up to 10 pain pills per day, and you can also use benadryl over the counter for itching or also to help with sleep.   Precautions:  If you experience chest pain or shortness of breath - call 911 immediately for transfer to the hospital emergency department!!  If you develop a fever greater that 101 F, purulent drainage from wound, increased redness or drainage from wound, or calf pain -- Call the office at [phone]                                                Follow- Up Appointment:  Please call for an appointment to be seen in 2 weeks [place] - [phone]

## 2020-06-21 NOTE — Transfer of Care (Signed)
Immediate Anesthesia Transfer of Care Note  Patient: Kayan D Beougher  Procedure(s) Performed: IRRIGATION AND DEBRIDEMENT LEFT UPPER EXTREMITY (Left: Hand)  Patient Location: PACU  Anesthesia Type:General  Level of Consciousness: awake and alert   Airway & Oxygen Therapy: Patient Spontanous Breathing and Patient connected to face mask oxygen  Post-op Assessment: Report given to RN and Post -op Vital signs reviewed and stable  Post vital signs: Reviewed and stable  Last Vitals:  Vitals Value Taken Time  BP 96/50 06/21/20 0241  Temp    Pulse 82 06/21/20 0249  Resp 20 06/21/20 0249  SpO2 97 % 06/21/20 0249  Vitals shown include unvalidated device data.  Last Pain:  Vitals:   06/20/20 2240  TempSrc: Axillary         Complications: No notable events documented.

## 2020-06-21 NOTE — Op Note (Signed)
06/21/2020  8:18 AM  PATIENT:  Jeffrey Hodge    PRE-OPERATIVE DIAGNOSIS:  Dog Bite to Left Hand  POST-OPERATIVE DIAGNOSIS:  Same  PROCEDURE:  IRRIGATION AND DEBRIDEMENT LEFT UPPER EXTREMITY  SURGEON:  Sheral Apley, MD  ASSISTANT: Janine Ores, PA-C, he was present and scrubbed throughout the case, critical for completion in a timely fashion, and for retraction, instrumentation, and closure.   ANESTHESIA:   gen  PREOPERATIVE INDICATIONS:  Jeffrey Hodge is a  25 y.o. male with a diagnosis of Dog Bite to Left Hand who failed conservative measures and elected for surgical management.    The risks benefits and alternatives were discussed with the patient preoperatively including but not limited to the risks of infection, bleeding, nerve injury, cardiopulmonary complications, the need for revision surgery, among others, and the patient was willing to proceed.  OPERATIVE IMPLANTS: none  OPERATIVE FINDINGS: lacerations x 4  BLOOD LOSS: min  COMPLICATIONS: nonne  TOURNIQUET TIME:  OPERATIVE PROCEDURE:  Patient was identified in the preoperative holding area and site was marked by me He was transported to the operating theater and placed on the table in supine position taking care to pad all bony prominences. After a preincinduction time out anesthesia was induced. The left upper extremity was prepped and draped in normal sterile fashion and a pre-incision timeout was performed. He received ancef and augmentin for preoperative antibiotics.   I explored his left hand and ring finger wounds he had 4 lacerations down the dorsum of his ring finger to over his dorsal hand 1 over the volar pad of the ring finger.  The dorsal 1 totaled roughly 14 cm down through the webspace.  I debrided any devitalized tissue as per below.  I performed a tenolysis of his extensor tendon as well as FDP and FDS to the ring finger through his volar incision.  He had palpable radial and ulnar pulse to  the finger.  I irrigated with 3 L of saline.  Next I performed a complex closure of all 4 wounds dorsal finger of roughly 14 cm volar of to other dorsal wounds of less than 5 another volar wound of 8 cm.  I then placed a sterile dressing and a volar slab splint he was awoken and taken the PACU in stable condition   POST OPERATIVE PLAN: Mobilize for DVT prophylaxis splint full-time continue antibiotics while inpatient  Debridement type: Excisional Debridement  Side: left  Body Location: hand and ring finger   Tools used for debridement: scissors  Pre-debridement Wound size (cm):   Length: 17cm        Width: 1     Depth: 1   Post-debridement Wound size (cm):   Length: 17        Width: 0     Depth: 0   Debridement depth beyond dead/damaged tissue down to healthy viable tissue: yes  Tissue layer involved: skin, subcutaneous tissue  Nature of tissue removed: Devitalized Tissue  Irrigation volume: 3L     Irrigation fluid type: Normal Saline

## 2020-06-22 ENCOUNTER — Encounter (HOSPITAL_COMMUNITY): Payer: Self-pay | Admitting: Orthopedic Surgery

## 2020-06-22 NOTE — Discharge Summary (Signed)
Discharge Summary  Patient ID: Jeffrey Hodge MRN: [id number] DOB/AGE: 03/11/1995 25 y.o.  Admit date: 06/20/2020 Discharge date: 06/21/2020  Admission Diagnoses: Active Problems:   Open wound of finger of left hand due to dog bite  Discharge Diagnoses:  Active Problems:   Open wound of finger of left hand due to dog bite   Past Medical History:  Diagnosis Date   Asthma    Down syndrome    Sleep apnea     Surgeries: Procedure(s): IRRIGATION AND DEBRIDEMENT LEFT UPPER EXTREMITY on 06/21/2020   Consultants (if any):   Discharged Condition: Improved  Hospital Course: Emmanual D Zukas is an 25 y.o. male who was admitted 06/20/2020 with a diagnosis of open wound of finger of the left hand due to dog bite and went to the operating room on 06/21/2020 and underwent the above named procedures.    He was given perioperative antibiotics:  Anti-infectives (From admission, onward)    Start     Dose/Rate Route Frequency Ordered Stop   06/21/20 1000  amoxicillin-clavulanate (AUGMENTIN) 875-125 MG per tablet 1 tablet  Status:  Discontinued        1 tablet Oral Every 12 hours 06/21/20 0343 06/21/20 1604   06/21/20 0115  Ampicillin-Sulbactam (UNASYN) 3 g in sodium chloride 0.9 % 100 mL IVPB        3 g 200 mL/hr over 30 Minutes Intravenous  Once 06/21/20 0107 06/21/20 0152   06/21/20 0000  amoxicillin-clavulanate (AUGMENTIN) 875-125 MG tablet        1 tablet Oral 2 times daily 06/21/20 0926 07/01/20 2359   06/20/20 2300  ciprofloxacin (CIPRO) IVPB 400 mg  Status:  Discontinued        400 mg 200 mL/hr over 60 Minutes Intravenous  Once 06/20/20 2259 06/21/20 0343     .  He was given sequential compression devices, early ambulation, for DVT prophylaxis.  He benefited maximally from the hospital stay and there were no complications.    Recent vital signs:  Vitals:   06/21/20 0344 06/21/20 0817  BP: 122/61 131/77  Pulse:  65  Resp: 18 20  Temp: (!) 97 F (36.1 C) (!) 97.1 F (36.2  C)  SpO2: 95% 93%    Recent laboratory studies:  Lab Results  Component Value Date   HGB 12.5 (L) 06/21/2020   HGB 13.8 06/26/2013   HGB 12.8 (L) 12/09/2012   Lab Results  Component Value Date   WBC 11.3 (H) 06/21/2020   PLT 251 06/21/2020   No results found for: INR Lab Results  Component Value Date   NA 138 06/26/2013   K 3.6 06/26/2013   CL 106 06/26/2013   CO2 26 (H) 06/26/2013   BUN 10 06/26/2013   CREATININE 0.70 06/26/2013   GLUCOSE 101 (H) 06/26/2013    Discharge Medications:   Allergies as of 06/21/2020       Reactions   Meningococcal And Conjugated Vaccines Hives        Medication List     TAKE these medications    amoxicillin-clavulanate 875-125 MG tablet Commonly known as: Augmentin Take 1 tablet by mouth 2 (two) times daily for 10 days.   cetirizine 1 MG/ML syrup Commonly known as: ZYRTEC   Denta 5000 Plus 1.1 % Crea dental cream Generic drug: sodium fluoride   doxazosin 1 MG tablet Commonly known as: CARDURA   fluticasone 50 MCG/ACT nasal spray Commonly known as: FLONASE   HYDROcodone-acetaminophen 5-325 MG tablet Commonly known as: Norco Take  1-2 tablets by mouth every 6 (six) hours as needed for moderate pain. MAXIMUM TOTAL ACETAMINOPHEN DOSE IS 4000 MG PER DAY   Loratadine Childrens 5 MG/5ML syrup Generic drug: loratadine   ondansetron 4 MG tablet Commonly known as: Zofran Take 1 tablet (4 mg total) by mouth every 8 (eight) hours as needed for nausea or vomiting.   prednisoLONE 15 MG/5ML solution Commonly known as: ORAPRED   Qvar 40 MCG/ACT inhaler Generic drug: beclomethasone   triamcinolone cream 0.1 % Commonly known as: KENALOG   triamcinolone 0.025 % cream Commonly known as: KENALOG        Diagnostic Studies: DG Hand 2 View Left  Result Date: 06/20/2020 CLINICAL DATA:  Dog bite. EXAM: LEFT HAND - 2 VIEW COMPARISON:  None. FINDINGS: There is no evidence of fracture or dislocation. There is no evidence of  arthropathy or other focal bone abnormality. Soft tissue edema noted about the mid metacarpal phalangeal joints. No radiopaque foreign body. IMPRESSION: Soft tissue edema. No radiopaque foreign body or osseous abnormality. Electronically Signed   By: Narda Rutherford M.D.   On: 06/20/2020 23:25    Disposition: Discharge disposition: 01-Home or Self Care          Follow-up Information     Sheral Apley, MD. Schedule an appointment as soon as possible for a visit in 1 week(s).   Specialty: Orthopedic Surgery Why: For wound re-check Contact information: [address] [place] [address] [phone]                  Signed: Annita Brod 06/22/2020, 10:32 AM

## 2020-06-22 NOTE — Anesthesia Postprocedure Evaluation (Signed)
Anesthesia Post Note  Patient: Tymel D Gibeau  Procedure(s) Performed: IRRIGATION AND DEBRIDEMENT LEFT UPPER EXTREMITY (Left: Hand)     Patient location during evaluation: PACU Anesthesia Type: General Level of consciousness: awake and alert Pain management: pain level controlled Vital Signs Assessment: post-procedure vital signs reviewed and stable Respiratory status: spontaneous breathing, nonlabored ventilation, respiratory function stable and patient connected to nasal cannula oxygen Cardiovascular status: blood pressure returned to baseline and stable Postop Assessment: no apparent nausea or vomiting Anesthetic complications: no   No notable events documented.  Last Vitals:  Vitals:   06/21/20 0344 06/21/20 0817  BP: 122/61 131/77  Pulse:  65  Resp: 18 20  Temp: (!) 36.1 C (!) 36.2 C  SpO2: 95% 93%    Last Pain:  Vitals:   06/21/20 1000  TempSrc:   PainSc: Asleep                 Beryle Lathe

## 2021-03-16 ENCOUNTER — Institutional Professional Consult (permissible substitution): Payer: Medicaid Other | Admitting: Pulmonary Disease

## 2021-08-27 ENCOUNTER — Emergency Department: Payer: Medicaid Other

## 2021-08-27 ENCOUNTER — Other Ambulatory Visit: Payer: Self-pay

## 2021-08-27 ENCOUNTER — Encounter: Payer: Self-pay | Admitting: Intensive Care

## 2021-08-27 ENCOUNTER — Inpatient Hospital Stay
Admission: EM | Admit: 2021-08-27 | Discharge: 2021-08-29 | DRG: 176 | Disposition: A | Discharge status: Home / Self Care | Payer: Medicaid Other | Attending: Internal Medicine | Admitting: Internal Medicine

## 2021-08-27 DIAGNOSIS — Z823 Family history of stroke: Secondary | ICD-10-CM

## 2021-08-27 DIAGNOSIS — L409 Psoriasis, unspecified: Secondary | ICD-10-CM | POA: Diagnosis present

## 2021-08-27 DIAGNOSIS — J45909 Unspecified asthma, uncomplicated: Secondary | ICD-10-CM | POA: Diagnosis present

## 2021-08-27 DIAGNOSIS — Z801 Family history of malignant neoplasm of trachea, bronchus and lung: Secondary | ICD-10-CM

## 2021-08-27 DIAGNOSIS — I82452 Acute embolism and thrombosis of left peroneal vein: Secondary | ICD-10-CM | POA: Diagnosis present

## 2021-08-27 DIAGNOSIS — Z7984 Long term (current) use of oral hypoglycemic drugs: Secondary | ICD-10-CM

## 2021-08-27 DIAGNOSIS — I2699 Other pulmonary embolism without acute cor pulmonale: Principal | ICD-10-CM | POA: Diagnosis present

## 2021-08-27 DIAGNOSIS — I82432 Acute embolism and thrombosis of left popliteal vein: Secondary | ICD-10-CM | POA: Diagnosis present

## 2021-08-27 DIAGNOSIS — I82409 Acute embolism and thrombosis of unspecified deep veins of unspecified lower extremity: Secondary | ICD-10-CM

## 2021-08-27 DIAGNOSIS — Z8371 Family history of colonic polyps: Secondary | ICD-10-CM

## 2021-08-27 DIAGNOSIS — Z887 Allergy status to serum and vaccine status: Secondary | ICD-10-CM

## 2021-08-27 DIAGNOSIS — Q909 Down syndrome, unspecified: Secondary | ICD-10-CM

## 2021-08-27 DIAGNOSIS — Z8249 Family history of ischemic heart disease and other diseases of the circulatory system: Secondary | ICD-10-CM

## 2021-08-27 DIAGNOSIS — R0902 Hypoxemia: Secondary | ICD-10-CM | POA: Diagnosis present

## 2021-08-27 DIAGNOSIS — E8881 Metabolic syndrome: Secondary | ICD-10-CM | POA: Diagnosis present

## 2021-08-27 DIAGNOSIS — I82442 Acute embolism and thrombosis of left tibial vein: Secondary | ICD-10-CM | POA: Diagnosis present

## 2021-08-27 DIAGNOSIS — Z6841 Body Mass Index (BMI) 40.0 and over, adult: Secondary | ICD-10-CM

## 2021-08-27 DIAGNOSIS — G4733 Obstructive sleep apnea (adult) (pediatric): Secondary | ICD-10-CM | POA: Diagnosis present

## 2021-08-27 DIAGNOSIS — Z7985 Long-term (current) use of injectable non-insulin antidiabetic drugs: Secondary | ICD-10-CM

## 2021-08-27 LAB — COMPREHENSIVE METABOLIC PANEL
ALT: 11 U/L (ref 0–44)
AST: 16 U/L (ref 15–41)
Albumin: 3.6 g/dL (ref 3.5–5.0)
Alkaline Phosphatase: 79 U/L (ref 38–126)
Anion gap: 9 (ref 5–15)
BUN: 12 mg/dL (ref 6–20)
CO2: 27 mmol/L (ref 22–32)
Calcium: 9 mg/dL (ref 8.9–10.3)
Chloride: 105 mmol/L (ref 98–111)
Creatinine, Ser: 1.06 mg/dL (ref 0.61–1.24)
GFR, Estimated: >60 mL/min (ref >60)
Glucose, Bld: 129 mg/dL — ABNORMAL HIGH (ref 70–99)
Potassium: 3.5 mmol/L (ref 3.5–5.1)
Sodium: 141 mmol/L (ref 135–145)
Total Bilirubin: 1.2 mg/dL (ref 0.3–1.2)
Total Protein: 7.4 g/dL (ref 6.5–8.1)

## 2021-08-27 LAB — CBC WITH DIFFERENTIAL/PLATELET
Abs Immature Granulocytes: 0.05 10*3/uL (ref 0.00–0.07)
Basophils Absolute: 0.1 10*3/uL (ref 0.0–0.1)
Basophils Relative: 1 %
Eosinophils Absolute: 0 10*3/uL (ref 0.0–0.5)
Eosinophils Relative: 0 %
HCT: 39.2 % (ref 39.0–52.0)
Hemoglobin: 12.4 g/dL — ABNORMAL LOW (ref 13.0–17.0)
Immature Granulocytes: 1 %
Lymphocytes Relative: 24 %
Lymphs Abs: 1.8 10*3/uL (ref 0.7–4.0)
MCH: 28.7 pg (ref 26.0–34.0)
MCHC: 31.6 g/dL (ref 30.0–36.0)
MCV: 90.7 fL (ref 80.0–100.0)
Monocytes Absolute: 0.3 10*3/uL (ref 0.1–1.0)
Monocytes Relative: 4 %
Neutro Abs: 5.3 10*3/uL (ref 1.7–7.7)
Neutrophils Relative %: 70 %
Platelets: 275 10*3/uL (ref 150–400)
RBC: 4.32 MIL/uL (ref 4.22–5.81)
RDW: 14.5 % (ref 11.5–15.5)
WBC: 7.5 10*3/uL (ref 4.0–10.5)
nRBC: 0 % (ref 0.0–0.2)

## 2021-08-27 LAB — CK: Total CK: 47 U/L — ABNORMAL LOW (ref 49–397)

## 2021-08-27 LAB — TROPONIN I (HIGH SENSITIVITY): Troponin I (High Sensitivity): 5 ng/L (ref <18)

## 2021-08-27 MED ORDER — HEPARIN (PORCINE) 25000 UT/250ML-% IV SOLN
1750.0000 [IU]/h | INTRAVENOUS | Status: DC
  Administered 2021-08-28: 1550 [IU]/h via INTRAVENOUS
  Administered 2021-08-28: 1400 [IU]/h via INTRAVENOUS
  Administered 2021-08-29: 1750 [IU]/h via INTRAVENOUS
  Filled 2021-08-27 (×3): qty 250

## 2021-08-27 MED ORDER — HEPARIN BOLUS VIA INFUSION
5800.0000 [IU] | Freq: Once | INTRAVENOUS | Status: AC
  Administered 2021-08-28: 5800 [IU] via INTRAVENOUS
  Filled 2021-08-27: qty 5800

## 2021-08-27 MED ORDER — HEPARIN SODIUM (PORCINE) 5000 UNIT/ML IJ SOLN: 4000.0000 [IU] | Freq: Once | INTRAMUSCULAR | Status: DC

## 2021-08-27 MED ORDER — IOHEXOL 350 MG/ML SOLN
100.0000 mL | Freq: Once | INTRAVENOUS | Status: AC | PRN
  Administered 2021-08-27: 100 mL via INTRAVENOUS

## 2021-08-27 NOTE — H&P (Signed)
History and Physical    Jeffrey Hodge TIR:[id number] DOB: 21-Dec-1995 DOA: 08/27/2021  PCP: Nira Retort  Patient coming from: home  I have personally briefly reviewed patient's old medical records in [facility]  Chief Complaint: left calf pain  HPI: Jeffrey Hodge is a 26 y.o. male with medical history significant of  Down syndrome, OSA on cpap qhs, asthma who presents to ED with complaint of calf pain.   ED Course:  On evaluation in ED patient is found to have DVT as well as B/L PE without heart strain. Case was discussed with on call vascular who notes no need for vascular intervention and solely recommends heparin.   Vitals Afeb, bp 122/73, hr 75, rr 20 , sat 91%  Labs: Wbc: 7.5, hg 12.4, plt 275 ,  NA 141, K 3.5, gly 129, cr 1.06  CK 47 Ce 5 Left lower extremity u/s IMPRESSION: Positive exam for left popliteal DVT extending into the tibial and peroneal calf veins.   CTPA   IMPRESSION: Bilateral significant pulmonary emboli although no right heart strain is noted.   No other focal abnormality is noted    Review of Systems: As per HPI otherwise 10 point review of systems negative.   Past Medical History:  Diagnosis Date   Asthma    Down syndrome    Sleep apnea     Past Surgical History:  Procedure Laterality Date   I & D EXTREMITY Left 06/21/2020   Procedure: IRRIGATION AND DEBRIDEMENT LEFT UPPER EXTREMITY;  Surgeon: Sheral Apley, MD;  Location: [facility] OR;  Service: Orthopedics;  Laterality: Left;   TONSILLECTOMY       reports that he has never smoked. He has never been exposed to tobacco smoke. He has never used smokeless tobacco. He reports that he does not drink alcohol and does not use drugs.  Allergies  Allergen Reactions   Meningococcal And Conjugated Vaccines Hives    History reviewed. No pertinent family history. Problem Relation Age of Onset   Lung cancer Mother  carcinoid   Dementia Maternal Grandmother   Stroke  Maternal Grandfather   Lung cancer Maternal Grandfather   Lung cancer Paternal Grandfather   Colon polyps Father   High blood pressure (Hypertension) Father   No Known Problems Sister   No Known Problems Brother   COPD Paternal Grandmother   No Known Problems Brother  Prior to Admission medications   Medication Sig Start Date End Date Taking? Authorizing Provider  cetirizine HCl (CETIRIZINE HCL CHILDRENS ALRGY) 5 MG/5ML SOLN Take by mouth daily. 02/07/21 02/07/22 Yes [provider]  Clobetasol Propionate 0.05 % shampoo Apply topically. 06/02/21  Yes [provider]  fluticasone (FLONASE) 50 MCG/ACT nasal spray Place 2 sprays into both nostrils daily. 04/26/14  Yes [provider]  metFORMIN (GLUCOPHAGE) 500 MG tablet Take 500 mg by mouth 2 (two) times daily. 06/08/21  Yes [provider]  montelukast (SINGULAIR) 5 MG chewable tablet Chew 10 mg by mouth at bedtime. 04/06/21  Yes [provider]    Physical Exam: Vitals:   08/27/21 1549 08/27/21 1552  BP: 122/73   Pulse: 75   Resp: 20   Temp: 97.7 F (36.5 C)   TempSrc: Oral   SpO2: 91%   Weight:  130.2 kg  Height:  5' (1.524 m)    Vitals:   08/27/21 1549 08/27/21 1552  BP: 122/73   Pulse: 75   Resp: 20   Temp: 97.7 F (36.5 C)  TempSrc: Oral   SpO2: 91%   Weight:  130.2 kg  Height:  5' (1.524 m)   Constitutional: NAD, calm, comfortable Eyes: PERRL, lids and conjunctivae normal ENMT: Mucous membranes are moist. Posterior pharynx clear of any exudate or lesions.Normal dentition.  Neck: normal, supple, no masses, no thyromegaly Respiratory: clear to auscultation bilaterally, no wheezing, no crackles. Normal respiratory effort. No accessory muscle use.  Cardiovascular: Regular rate and rhythm, no murmurs / rubs / gallops. No extremity edema. 2+ pedal pulses. No carotid bruits.  Abdomen: no tenderness, no masses palpated. No hepatosplenomegaly. Bowel sounds positive.  Musculoskeletal:  no clubbing / cyanosis. No joint deformity upper and lower extremities. Good ROM, no contractures. Normal muscle tone.  Skin: no rashes, lesions, ulcers. No induration Neurologic: CN 2-12 grossly intact. Sensation intact, DTR normal. Strength 5/5 in all 4.  Psychiatric: Normal judgment and insight. Alert and oriented x 3. Normal mood.    Labs on Admission: I have personally reviewed following labs and imaging studies  CBC: Recent Labs  Lab 08/27/21 2140  WBC 7.5  NEUTROABS 5.3  HGB 12.4*  HCT 39.2  MCV 90.7  PLT 275   Basic Metabolic Panel: Recent Labs  Lab 08/27/21 2140  NA 141  K 3.5  CL 105  CO2 27  GLUCOSE 129*  BUN 12  CREATININE 1.06  CALCIUM 9.0   GFR: Estimated Creatinine Clearance: 123.7 mL/min (by C-G formula based on SCr of 1.06 mg/dL). Liver Function Tests: Recent Labs  Lab 08/27/21 2140  AST 16  ALT 11  ALKPHOS 79  BILITOT 1.2  PROT 7.4  ALBUMIN 3.6   No results for input(s): "LIPASE", "AMYLASE" in the last 168 hours. No results for input(s): "AMMONIA" in the last 168 hours. Coagulation Profile: No results for input(s): "INR", "PROTIME" in the last 168 hours. Cardiac Enzymes: Recent Labs  Lab 08/27/21 2140  CKTOTAL 47*   BNP (last 3 results) No results for input(s): "PROBNP" in the last 8760 hours. HbA1C: No results for input(s): "HGBA1C" in the last 72 hours. CBG: No results for input(s): "GLUCAP" in the last 168 hours. Lipid Profile: No results for input(s): "CHOL", "HDL", "LDLCALC", "TRIG", "CHOLHDL", "LDLDIRECT" in the last 72 hours. Thyroid Function Tests: No results for input(s): "TSH", "T4TOTAL", "FREET4", "T3FREE", "THYROIDAB" in the last 72 hours. Anemia Panel: No results for input(s): "VITAMINB12", "FOLATE", "FERRITIN", "TIBC", "IRON", "RETICCTPCT" in the last 72 hours. Urine analysis:    Component Value Date/Time   COLORURINE Yellow 06/26/2013 1940   APPEARANCEUR Clear 06/26/2013 1940   LABSPEC 1.015 06/26/2013 1940    PHURINE 5.0 06/26/2013 1940   GLUCOSEU Negative 06/26/2013 1940   HGBUR Negative 06/26/2013 1940   BILIRUBINUR Negative 06/26/2013 1940   KETONESUR Negative 06/26/2013 1940   PROTEINUR Negative 06/26/2013 1940   NITRITE Negative 06/26/2013 1940   LEUKOCYTESUR Negative 06/26/2013 1940    Radiological Exams on Admission: CT Angio Chest PE W and/or Wo Contrast  Result Date: 08/27/2021 CLINICAL DATA:  Chest pain and shortness of breath with known history of DVT EXAM: CT ANGIOGRAPHY CHEST WITH CONTRAST TECHNIQUE: Multidetector CT imaging of the chest was performed using the standard protocol during bolus administration of intravenous contrast. Multiplanar CT image reconstructions and MIPs were obtained to evaluate the vascular anatomy. RADIATION DOSE REDUCTION: This exam was performed according to the departmental dose-optimization program which includes automated exposure control, adjustment of the mA and/or kV according to patient size and/or use of iterative reconstruction technique. CONTRAST:  OMNIPAQUE IOHEXOL 350 MG/ML  SOLN COMPARISON:  None Available. FINDINGS: Cardiovascular: Thoracic aorta shows a normal branching pattern. No aneurysmal dilatation or dissection is noted. No cardiac enlargement is seen. No coronary calcifications are noted. Pulmonary artery shows a normal branching pattern bilaterally. Lobar pulmonary emboli are noted bilaterally although no right heart strain is seen. Mediastinum/Nodes: Thoracic inlet is within normal limits. No hilar or mediastinal adenopathy is noted. The esophagus is within normal limits. Lungs/Pleura: Lungs are well aerated bilaterally. No focal infiltrate or sizable effusion is seen. No sizable parenchymal nodules are noted. Upper Abdomen: Visualized upper abdomen is unremarkable. Musculoskeletal: Degenerative changes of the thoracic spine are seen. No compression deformities are noted. Review of the MIP images confirms the above findings. IMPRESSION:  Bilateral significant pulmonary emboli although no right heart strain is noted. No other focal abnormality is noted. Critical Value/emergent results were called by telephone at the time of interpretation on 08/27/2021 at 10:46 pm to provider Billee Cashing, PA , who verbally acknowledged these results. Electronically Signed   By: Alcide Clever M.D.   On: 08/27/2021 22:48   DG Tibia/Fibula Left  Result Date: 08/27/2021 CLINICAL DATA:  Acute LEFT leg pain for 1 day.  No known injury. EXAM: LEFT TIBIA AND FIBULA - 2 VIEW COMPARISON:  None Available. FINDINGS: No acute fracture or dislocation noted. No focal bony lesions are present. No definite soft tissue abnormalities are present. IMPRESSION: Negative. Electronically Signed   By: Harmon Pier M.D.   On: 08/27/2021 19:21   US Venous Img Lower Unilateral Left (DVT)  Result Date: 08/27/2021 CLINICAL DATA:  Left lower extremity pain since earlier today with edema EXAM: LEFT LOWER EXTREMITY VENOUS DOPPLER ULTRASOUND TECHNIQUE: Gray-scale sonography with graded compression, as well as color Doppler and duplex ultrasound were performed to evaluate the lower extremity deep venous systems from the level of the common femoral vein and including the common femoral, femoral, profunda femoral, popliteal and calf veins including the posterior tibial, peroneal and gastrocnemius veins when visible. Spectral Doppler was utilized to evaluate flow at rest and with distal augmentation maneuvers in the common femoral, femoral and popliteal veins. COMPARISON:  None Available. FINDINGS: Contralateral Common Femoral Vein: Respiratory phasicity is normal and symmetric with the symptomatic side. No evidence of thrombus. Normal compressibility. Common Femoral Vein: No evidence of thrombus. Normal compressibility, respiratory phasicity and response to augmentation. Saphenofemoral Junction: No evidence of thrombus. Normal compressibility and flow on color Doppler imaging. Profunda Femoral  Vein: No evidence of thrombus. Normal compressibility and flow on color Doppler imaging. Femoral Vein: No evidence of thrombus. Normal compressibility, respiratory phasicity and response to augmentation. Popliteal Vein: Nonocclusive hypoechoic thrombus. Vessel partially compressible. Augmentation not performed. Calf Veins: Thrombus does appear to extend into the calf tibial peroneal veins with limited visualization. IMPRESSION: Positive exam for left popliteal DVT extending into the tibial and peroneal calf veins. Electronically Signed   By: Judie Petit.  Shick M.D.   On: 08/27/2021 18:48    EKG: Independently reviewed.   Assessment/Plan  Acute B/L PE with heart strain /associated intermittent hypoxemia Acute left lower extremity DVT -admit to progressive care -continue with heparin drip - echo in am to be complete  -monitor on continuous pulse ox   OSA -cpap qhs  -keep sat >92%   Asthma  -no acute exacerbation  -resume home inhalers  -continue Singulair , flonase ,claritin  Psoriasis -no active issues  -continue topical therapy     DVT prophylaxis: heparin drip  Code Status: Full Family Communication: Disposition Plan: patient  expected to be admitted greater than 2 midnights  Consults called:n/a per vascular no intervention needed  Admission status: progressive care   Lurline Del MD [facility]   If 7PM-7AM, please contact night-coverage [email or web address] Password [facility]  08/27/2021, 11:35 PM

## 2021-08-27 NOTE — Progress Notes (Signed)
ANTICOAGULATION CONSULT NOTE  Pharmacy Consult for heparin infusion Indication: pulmonary embolus  Allergies  Allergen Reactions   Meningococcal And Conjugated Vaccines Hives    Patient Measurements: Height: 5' (152.4 cm) Weight: 130.2 kg (287 lb) IBW/kg (Calculated) : 50 Heparin Dosing Weight: 82.8 kg  Vital Signs: Temp: 97.7 F (36.5 C) (08/26 1549) Temp Source: Oral (08/26 1549) BP: 122/73 (08/26 1549) Pulse Rate: 75 (08/26 1549)  Labs: Recent Labs    08/27/21 2140  HGB 12.4*  HCT 39.2  PLT 275  CREATININE 1.06  CKTOTAL 47*  TROPONINIHS 5    Estimated Creatinine Clearance: 123.7 mL/min (by C-G formula based on SCr of 1.06 mg/dL).   Medical History: Past Medical History:  Diagnosis Date   Asthma    Down syndrome    Sleep apnea     Assessment: Pt is a 25 yo male w/ hx of Down syndrome presenting to ED c/o calf pain found w/ DVT as well as B/L PE without heart strain.  Goal of Therapy:  Heparin level 0.3-0.7 units/ml Monitor platelets by anticoagulation protocol: Yes  Plan:  Bolus 5800 units x 1 Start heparin infusion at 1400 units/hr Will check HL in 6 hr after start of infusion CBC daily while on heparin  Otelia Sergeant, PharmD, [facility] 08/27/2021 11:24 PM

## 2021-08-27 NOTE — ED Triage Notes (Signed)
Patient c/o left leg pain that started this AM. Knots present on shin. No redness or warmth noted

## 2021-08-27 NOTE — ED Notes (Signed)
Assessed for IV placement at L arm; unable to find vein adequate currently to poke. [place] may be necessary.

## 2021-08-27 NOTE — ED Provider Notes (Signed)
[facility] Provider Note  Patient Contact: 6:47 PM (approximate)   History   Leg Pain (left)   HPI  Jeffrey Hodge is a 26 y.o. male with a history of Down syndrome, sleep apnea and asthma, presents to the emergency department with leg pain.  Patient has been complaining of left calf pain for the past 2 to 3 days.  Mom states that patient was shopping at [employer] earlier in the day and was asking to sit down because his calf was tender.  Mom has not noticed any erythema overlying left calf.  No strenuous activities or other injuries to her knowledge.       Physical Exam   Triage Vital Signs: ED Triage Vitals  Enc Vitals Group     BP 08/27/21 1549 122/73     Pulse Rate 08/27/21 1549 75     Resp 08/27/21 1549 20     Temp 08/27/21 1549 97.7 F (36.5 C)     Temp Source 08/27/21 1549 Oral     SpO2 08/27/21 1549 91 %     Weight 08/27/21 1552 287 lb (130.2 kg)     Height 08/27/21 1552 5' (1.524 m)     Head Circumference --      Peak Flow --      Pain Score --      Pain Loc --      Pain Edu? --      Excl. in GC? --     Most recent vital signs: Vitals:   08/27/21 1549  BP: 122/73  Pulse: 75  Resp: 20  Temp: 97.7 F (36.5 C)  SpO2: 91%     General: Alert and in no acute distress. Eyes:  PERRL. EOMI. Head: No acute traumatic findings ENT:      Nose: No congestion/rhinnorhea.      Mouth/Throat: Mucous membranes are moist.  Neck: No stridor. No cervical spine tenderness to palpation. Cardiovascular:  Good peripheral perfusion Respiratory: Normal respiratory effort without tachypnea or retractions. Lungs CTAB. Good air entry to the bases with no decreased or absent breath sounds. Gastrointestinal: Bowel sounds 4 quadrants. Soft and nontender to palpation. No guarding or rigidity. No palpable masses. No distention. No CVA tenderness. Musculoskeletal: Full range of motion to all extremities.  Neurologic:  No gross focal neurologic deficits  are appreciated.  Skin: Patient has 3 separate regions of palpable soft tissue swelling with no overlying erythema or induration.  Patient has no appreciable tenderness to palpation.    ED Results / Procedures / Treatments   Labs (all labs ordered are listed, but only abnormal results are displayed) Labs Reviewed  CBC WITH DIFFERENTIAL/PLATELET - Abnormal; Notable for the following components:      Result Value   Hemoglobin 12.4 (*)    All other components within normal limits  COMPREHENSIVE METABOLIC PANEL - Abnormal; Notable for the following components:   Glucose, Bld 129 (*)    All other components within normal limits  CK - Abnormal; Notable for the following components:   Total CK 47 (*)    All other components within normal limits  APTT  PROTIME-INR  TROPONIN I (HIGH SENSITIVITY)  TROPONIN I (HIGH SENSITIVITY)       RADIOLOGY  I personally viewed and evaluated these images as part of my medical decision making, as well as reviewing the written report by the radiologist.  ED Provider Interpretation: Patient has bilateral PEs without heart strain on CTA and left popliteal DVT extending  into the tibial veins on venous ultrasound   PROCEDURES:  Critical Care performed: No  Procedures   MEDICATIONS ORDERED IN ED: Medications  heparin bolus via infusion 5,800 Units (has no administration in time range)  heparin ADULT infusion 100 units/mL (25000 units/263mL) (has no administration in time range)  iohexol (OMNIPAQUE) 350 MG/ML injection 100 mL (100 mLs Intravenous Contrast Given 08/27/21 2224)     IMPRESSION / MDM / ASSESSMENT AND PLAN / ED COURSE  I reviewed the triage vital signs and the nursing notes.                              Assessment and plan: Leg pain:  26 year old male presents to the emergency department with left calf pain for the past 2 to 3 days.  Patient was satting at 91% on room air and was mildly tachypneic but vital signs were otherwise  reassuring.  CBC, CMP and troponin within range.  Initial venous ultrasound indicated DVT of the popliteal vein which extended into the tibial veins.  When I questioned mom about whether patient had experienced any chest tightness or shortness of breath, she stated that patient had been mildly hypoxic over the past 2-3 nights with O2 sats in the high 80s.  CT of the chest indicated bilateral PEs without heart strain.  I reached out to vascular specialist, Dr. Imogene Burn who agreed with anticoagulation.  We will admit patient under the care of the hospitalist team for observation and heparin.  Attending Dr. Darnelle Catalan agrees with plan of care.   FINAL CLINICAL IMPRESSION(S) / ED DIAGNOSES   Final diagnoses:  Other acute pulmonary embolism without acute cor pulmonale (HCC)  Acute deep vein thrombosis (DVT) of lower extremity, unspecified laterality, unspecified vein (HCC)     Rx / DC Orders   ED Discharge Orders     None        Note:  This document was prepared using Dragon voice recognition software and may include unintentional dictation errors.   Pia Mau Aldrich, PA-C 08/27/21 2341    Arnaldo Natal, MD 08/27/21 [phone]

## 2021-08-27 NOTE — ED Notes (Signed)
Two RNs try unsuccessfully to obtain IV access. IV team consult in place.

## 2021-08-28 DIAGNOSIS — Z8249 Family history of ischemic heart disease and other diseases of the circulatory system: Secondary | ICD-10-CM | POA: Diagnosis not present

## 2021-08-28 DIAGNOSIS — I2609 Other pulmonary embolism with acute cor pulmonale: Secondary | ICD-10-CM | POA: Diagnosis not present

## 2021-08-28 DIAGNOSIS — I82432 Acute embolism and thrombosis of left popliteal vein: Secondary | ICD-10-CM | POA: Diagnosis present

## 2021-08-28 DIAGNOSIS — E8881 Metabolic syndrome: Secondary | ICD-10-CM | POA: Diagnosis present

## 2021-08-28 DIAGNOSIS — Z887 Allergy status to serum and vaccine status: Secondary | ICD-10-CM | POA: Diagnosis not present

## 2021-08-28 DIAGNOSIS — Z7984 Long term (current) use of oral hypoglycemic drugs: Secondary | ICD-10-CM | POA: Diagnosis not present

## 2021-08-28 DIAGNOSIS — R0902 Hypoxemia: Secondary | ICD-10-CM | POA: Diagnosis present

## 2021-08-28 DIAGNOSIS — Z823 Family history of stroke: Secondary | ICD-10-CM | POA: Diagnosis not present

## 2021-08-28 DIAGNOSIS — Z8371 Family history of colonic polyps: Secondary | ICD-10-CM | POA: Diagnosis not present

## 2021-08-28 DIAGNOSIS — Z801 Family history of malignant neoplasm of trachea, bronchus and lung: Secondary | ICD-10-CM | POA: Diagnosis not present

## 2021-08-28 DIAGNOSIS — I2699 Other pulmonary embolism without acute cor pulmonale: Principal | ICD-10-CM

## 2021-08-28 DIAGNOSIS — Q909 Down syndrome, unspecified: Secondary | ICD-10-CM | POA: Diagnosis not present

## 2021-08-28 DIAGNOSIS — L409 Psoriasis, unspecified: Secondary | ICD-10-CM | POA: Diagnosis present

## 2021-08-28 DIAGNOSIS — Z6841 Body Mass Index (BMI) 40.0 and over, adult: Secondary | ICD-10-CM | POA: Diagnosis not present

## 2021-08-28 DIAGNOSIS — J45909 Unspecified asthma, uncomplicated: Secondary | ICD-10-CM | POA: Diagnosis present

## 2021-08-28 DIAGNOSIS — G4733 Obstructive sleep apnea (adult) (pediatric): Secondary | ICD-10-CM | POA: Diagnosis present

## 2021-08-28 DIAGNOSIS — Z7985 Long-term (current) use of injectable non-insulin antidiabetic drugs: Secondary | ICD-10-CM | POA: Diagnosis not present

## 2021-08-28 DIAGNOSIS — I82442 Acute embolism and thrombosis of left tibial vein: Secondary | ICD-10-CM | POA: Diagnosis present

## 2021-08-28 DIAGNOSIS — I82452 Acute embolism and thrombosis of left peroneal vein: Secondary | ICD-10-CM | POA: Diagnosis present

## 2021-08-28 LAB — CBC
HCT: 39 % (ref 39.0–52.0)
Hemoglobin: 12.2 g/dL — ABNORMAL LOW (ref 13.0–17.0)
MCH: 28.2 pg (ref 26.0–34.0)
MCHC: 31.3 g/dL (ref 30.0–36.0)
MCV: 90.3 fL (ref 80.0–100.0)
Platelets: 254 10*3/uL (ref 150–400)
RBC: 4.32 MIL/uL (ref 4.22–5.81)
RDW: 14.5 % (ref 11.5–15.5)
WBC: 6.2 10*3/uL (ref 4.0–10.5)
nRBC: 0 % (ref 0.0–0.2)

## 2021-08-28 LAB — HIV ANTIBODY (ROUTINE TESTING W REFLEX): HIV Screen 4th Generation wRfx: NONREACTIVE

## 2021-08-28 LAB — COMPREHENSIVE METABOLIC PANEL
ALT: 9 U/L (ref 0–44)
AST: 13 U/L — ABNORMAL LOW (ref 15–41)
Albumin: 3.4 g/dL — ABNORMAL LOW (ref 3.5–5.0)
Alkaline Phosphatase: 77 U/L (ref 38–126)
Anion gap: 5 (ref 5–15)
BUN: 13 mg/dL (ref 6–20)
CO2: 27 mmol/L (ref 22–32)
Calcium: 8.9 mg/dL (ref 8.9–10.3)
Chloride: 108 mmol/L (ref 98–111)
Creatinine, Ser: 1.08 mg/dL (ref 0.61–1.24)
GFR, Estimated: >60 mL/min (ref >60)
Glucose, Bld: 106 mg/dL — ABNORMAL HIGH (ref 70–99)
Potassium: 3.9 mmol/L (ref 3.5–5.1)
Sodium: 140 mmol/L (ref 135–145)
Total Bilirubin: 1 mg/dL (ref 0.3–1.2)
Total Protein: 7 g/dL (ref 6.5–8.1)

## 2021-08-28 LAB — APTT: aPTT: 33 seconds (ref 24–36)

## 2021-08-28 LAB — HEMOGLOBIN A1C
Hgb A1c MFr Bld: 5.5 % (ref 4.8–5.6)
Mean Plasma Glucose: 111.15 mg/dL

## 2021-08-28 LAB — PROTIME-INR
INR: 1 (ref 0.8–1.2)
Prothrombin Time: 13.5 seconds (ref 11.4–15.2)

## 2021-08-28 LAB — HEPARIN LEVEL (UNFRACTIONATED)
Heparin Unfractionated: 0.23 IU/mL — ABNORMAL LOW (ref 0.30–0.70)
Heparin Unfractionated: 0.25 IU/mL — ABNORMAL LOW (ref 0.30–0.70)

## 2021-08-28 LAB — TROPONIN I (HIGH SENSITIVITY): Troponin I (High Sensitivity): 4 ng/L (ref <18)

## 2021-08-28 MED ORDER — HEPARIN BOLUS VIA INFUSION
1200.0000 [IU] | Freq: Once | INTRAVENOUS | Status: AC
  Administered 2021-08-28: 1200 [IU] via INTRAVENOUS
  Filled 2021-08-28: qty 1200

## 2021-08-28 MED ORDER — HEPARIN BOLUS VIA INFUSION
2500.0000 [IU] | Freq: Once | INTRAVENOUS | Status: DC
Start: 2021-08-28 — End: 2021-08-28
  Filled 2021-08-28: qty 2500

## 2021-08-28 MED ORDER — FLUTICASONE PROPIONATE 50 MCG/ACT NA SUSP: 2.0000 | Freq: Every day | NASAL | Status: DC | PRN

## 2021-08-28 MED ORDER — ONDANSETRON HCL 4 MG/2ML IJ SOLN: 4.0000 mg | Freq: Four times a day (QID) | INTRAMUSCULAR | Status: DC | PRN

## 2021-08-28 MED ORDER — LORATADINE 5 MG/5ML PO SOLN
10.0000 mg | Freq: Every day | ORAL | Status: DC
  Administered 2021-08-28 – 2021-08-29 (×2): 10 mg via ORAL
  Filled 2021-08-28 (×2): qty 10

## 2021-08-28 MED ORDER — ONDANSETRON HCL 4 MG PO TABS: 4.0000 mg | ORAL_TABLET | Freq: Four times a day (QID) | ORAL | Status: DC | PRN

## 2021-08-28 MED ORDER — HEPARIN BOLUS VIA INFUSION
2000.0000 [IU] | Freq: Once | INTRAVENOUS | Status: AC
  Administered 2021-08-28: 2000 [IU] via INTRAVENOUS
  Filled 2021-08-28: qty 2000

## 2021-08-28 MED ORDER — ACETAMINOPHEN 650 MG RE SUPP: 650.0000 mg | Freq: Four times a day (QID) | RECTAL | Status: DC | PRN

## 2021-08-28 MED ORDER — ACETAMINOPHEN 325 MG PO TABS: 650.0000 mg | ORAL_TABLET | Freq: Four times a day (QID) | ORAL | Status: DC | PRN

## 2021-08-28 MED ORDER — MONTELUKAST SODIUM 10 MG PO TABS
10.0000 mg | ORAL_TABLET | Freq: Every day | ORAL | Status: DC
  Administered 2021-08-28: 10 mg via ORAL
  Filled 2021-08-28 (×2): qty 1

## 2021-08-28 NOTE — ED Notes (Signed)
Heparin with two rn verification. Pt with no needs.

## 2021-08-28 NOTE — ED Notes (Signed)
Pt brought to ED rm 17 at this time, this RN now assuming care. 

## 2021-08-28 NOTE — ED Notes (Signed)
Swelling and warmth noted on the left lower leg. Pedal pulse present in left foot.

## 2021-08-28 NOTE — Progress Notes (Signed)
ANTICOAGULATION CONSULT NOTE  Pharmacy Consult for heparin infusion Indication: pulmonary embolus  Allergies  Allergen Reactions   Meningococcal And Conjugated Vaccines Hives    Patient Measurements: Height: 5' (152.4 cm) Weight: 130.2 kg (287 lb) IBW/kg (Calculated) : 50 Heparin Dosing Weight: 82.8 kg  Vital Signs: Temp: 98.5 F (36.9 C) (08/26 2330) Temp Source: Axillary (08/26 2330) BP: 110/52 (08/27 0200) Pulse Rate: 73 (08/27 0615)  Labs: Recent Labs    08/27/21 2140 08/27/21 2357 08/28/21 0545  HGB 12.4*  --  12.2*  HCT 39.2  --  39.0  PLT 275  --  254  APTT  --  33  --   LABPROT  --  13.5  --   INR  --  1.0  --   HEPARINUNFRC  --   --  0.23*  CREATININE 1.06  --  1.08  CKTOTAL 47*  --   --   TROPONINIHS 5 4  --      Estimated Creatinine Clearance: 121.4 mL/min (by C-G formula based on SCr of 1.08 mg/dL).   Medical History: Past Medical History:  Diagnosis Date   Asthma    Down syndrome    Sleep apnea     Assessment: Pt is a 26 yo male w/ hx of Down syndrome presenting to ED c/o calf pain found w/ DVT as well as B/L PE without heart strain.  Goal of Therapy:  Heparin level 0.3-0.7 units/ml Monitor platelets by anticoagulation protocol: Yes  8/27 0545 HL 0.23, subtherapeutic   Plan:  Bolus 1200 units x 1 Increase heparin infusion to 1550 units/hr Will check HL in 6 hr after rate change CBC daily while on heparin  Otelia Sergeant, PharmD, [facility] 08/28/2021 6:33 AM

## 2021-08-28 NOTE — ED Notes (Signed)
Pt presents to ED with c/o leg pain in the left leg. Per Caregiver, pt has been feeling pain for the last 3 days and got worse today. Pt states it hurts to walk on it.

## 2021-08-28 NOTE — Progress Notes (Signed)
PROGRESS NOTE    Jeffrey Hodge  VBT:[id number] DOB: [birth year] DOA: 08/27/2021 PCP: Nira Retort    Assessment & Plan:   Principal Problem:   Pulmonary emboli (HCC)  Assessment and Plan:  Acute b/l pulmonary embolism: w/o right heart strain as per CTA chest. Continue on IV heparin. Echo ordered. Sedentary lifestyle   Left LE DVT: continue on IV heparin. Sedentary lifestyle   Metabolic syndrome: holding home dose of metformin but can restart at d/c. HbA1c is 5.5  OSA: CPAP qhs   Asthma: w/o exacerbation. Continue on bronchodilators  Psoriasis: continue on topical therapy  Morbid obesity: BMI 56.0. Complicates overall care & prognosis   Down's syndrome: continue w/ supportive care    DVT prophylaxis: IV heparin  Code Status: full  Family Communication: discussed pt's care w/ pt's family at bedside and answered their questions Disposition Plan: likely d/c back home   Level of care: Progressive   Status is: Inpatient Remains inpatient appropriate because: severity of illness   Consultants:    Procedures:   Antimicrobials:    Subjective: Pt c/o fatigue   Objective: Vitals:   08/28/21 0315 08/28/21 0445 08/28/21 0615 08/28/21 0640  BP:    125/76  Pulse: 73 67 73 75  Resp: 17 16 16 17   Temp:      TempSrc:      SpO2: 96% 98% 97% 98%  Weight:      Height:       No intake or output data in the 24 hours ending 08/28/21 0837 Filed Weights   08/27/21 1552  Weight: 130.2 kg    Examination:  General exam: Appears calm and comfortable. Morbidly obese Respiratory system: diminished breath sounds b/l  Cardiovascular system: S1 & S2 +. No  rubs, gallops or clicks.  Gastrointestinal system: Abdomen is obese, soft and nontender. Normal bowel sounds heard. Central nervous system: Alert and awake. Moves all extremities  Psychiatry: Judgement and insight appears at baseline.      Data Reviewed: I have personally reviewed following labs and  imaging studies  CBC: Recent Labs  Lab 08/27/21 2140 08/28/21 0545  WBC 7.5 6.2  NEUTROABS 5.3  --   HGB 12.4* 12.2*  HCT 39.2 39.0  MCV 90.7 90.3  PLT 275 254   Basic Metabolic Panel: Recent Labs  Lab 08/27/21 2140 08/28/21 0545  NA 141 140  K 3.5 3.9  CL 105 108  CO2 27 27  GLUCOSE 129* 106*  BUN 12 13  CREATININE 1.06 1.08  CALCIUM 9.0 8.9   GFR: Estimated Creatinine Clearance: 121.4 mL/min (by C-G formula based on SCr of 1.08 mg/dL). Liver Function Tests: Recent Labs  Lab 08/27/21 2140 08/28/21 0545  AST 16 13*  ALT 11 9  ALKPHOS 79 77  BILITOT 1.2 1.0  PROT 7.4 7.0  ALBUMIN 3.6 3.4*   No results for input(s): "LIPASE", "AMYLASE" in the last 168 hours. No results for input(s): "AMMONIA" in the last 168 hours. Coagulation Profile: Recent Labs  Lab 08/27/21 2357  INR 1.0   Cardiac Enzymes: Recent Labs  Lab 08/27/21 2140  CKTOTAL 47*   BNP (last 3 results) No results for input(s): "PROBNP" in the last 8760 hours. HbA1C: No results for input(s): "HGBA1C" in the last 72 hours. CBG: No results for input(s): "GLUCAP" in the last 168 hours. Lipid Profile: No results for input(s): "CHOL", "HDL", "LDLCALC", "TRIG", "CHOLHDL", "LDLDIRECT" in the last 72 hours. Thyroid Function Tests: No results for input(s): "TSH", "T4TOTAL", "FREET4", "T3FREE", "THYROIDAB"  in the last 72 hours. Anemia Panel: No results for input(s): "VITAMINB12", "FOLATE", "FERRITIN", "TIBC", "IRON", "RETICCTPCT" in the last 72 hours. Sepsis Labs: No results for input(s): "PROCALCITON", "LATICACIDVEN" in the last 168 hours.  No results found for this or any previous visit (from the past 240 hour(s)).       Radiology Studies: CT Angio Chest PE W and/or Wo Contrast  Result Date: 08/27/2021 CLINICAL DATA:  Chest pain and shortness of breath with known history of DVT EXAM: CT ANGIOGRAPHY CHEST WITH CONTRAST TECHNIQUE: Multidetector CT imaging of the chest was performed using the  standard protocol during bolus administration of intravenous contrast. Multiplanar CT image reconstructions and MIPs were obtained to evaluate the vascular anatomy. RADIATION DOSE REDUCTION: This exam was performed according to the departmental dose-optimization program which includes automated exposure control, adjustment of the mA and/or kV according to patient size and/or use of iterative reconstruction technique. CONTRAST:  OMNIPAQUE IOHEXOL 350 MG/ML SOLN COMPARISON:  None Available. FINDINGS: Cardiovascular: Thoracic aorta shows a normal branching pattern. No aneurysmal dilatation or dissection is noted. No cardiac enlargement is seen. No coronary calcifications are noted. Pulmonary artery shows a normal branching pattern bilaterally. Lobar pulmonary emboli are noted bilaterally although no right heart strain is seen. Mediastinum/Nodes: Thoracic inlet is within normal limits. No hilar or mediastinal adenopathy is noted. The esophagus is within normal limits. Lungs/Pleura: Lungs are well aerated bilaterally. No focal infiltrate or sizable effusion is seen. No sizable parenchymal nodules are noted. Upper Abdomen: Visualized upper abdomen is unremarkable. Musculoskeletal: Degenerative changes of the thoracic spine are seen. No compression deformities are noted. Review of the MIP images confirms the above findings. IMPRESSION: Bilateral significant pulmonary emboli although no right heart strain is noted. No other focal abnormality is noted. Critical Value/emergent results were called by telephone at the time of interpretation on 08/27/2021 at 10:46 pm to provider Billee Cashing, PA , who verbally acknowledged these results. Electronically Signed   By: Alcide Clever M.D.   On: 08/27/2021 22:48   DG Tibia/Fibula Left  Result Date: 08/27/2021 CLINICAL DATA:  Acute LEFT leg pain for 1 day.  No known injury. EXAM: LEFT TIBIA AND FIBULA - 2 VIEW COMPARISON:  None Available. FINDINGS: No acute fracture or  dislocation noted. No focal bony lesions are present. No definite soft tissue abnormalities are present. IMPRESSION: Negative. Electronically Signed   By: Harmon Pier M.D.   On: 08/27/2021 19:21   US Venous Img Lower Unilateral Left (DVT)  Result Date: 08/27/2021 CLINICAL DATA:  Left lower extremity pain since earlier today with edema EXAM: LEFT LOWER EXTREMITY VENOUS DOPPLER ULTRASOUND TECHNIQUE: Gray-scale sonography with graded compression, as well as color Doppler and duplex ultrasound were performed to evaluate the lower extremity deep venous systems from the level of the common femoral vein and including the common femoral, femoral, profunda femoral, popliteal and calf veins including the posterior tibial, peroneal and gastrocnemius veins when visible. Spectral Doppler was utilized to evaluate flow at rest and with distal augmentation maneuvers in the common femoral, femoral and popliteal veins. COMPARISON:  None Available. FINDINGS: Contralateral Common Femoral Vein: Respiratory phasicity is normal and symmetric with the symptomatic side. No evidence of thrombus. Normal compressibility. Common Femoral Vein: No evidence of thrombus. Normal compressibility, respiratory phasicity and response to augmentation. Saphenofemoral Junction: No evidence of thrombus. Normal compressibility and flow on color Doppler imaging. Profunda Femoral Vein: No evidence of thrombus. Normal compressibility and flow on color Doppler imaging. Femoral Vein: No evidence of  thrombus. Normal compressibility, respiratory phasicity and response to augmentation. Popliteal Vein: Nonocclusive hypoechoic thrombus. Vessel partially compressible. Augmentation not performed. Calf Veins: Thrombus does appear to extend into the calf tibial peroneal veins with limited visualization. IMPRESSION: Positive exam for left popliteal DVT extending into the tibial and peroneal calf veins. Electronically Signed   By: Judie Petit.  Shick M.D.   On: 08/27/2021 18:48         Scheduled Meds:  loratadine  10 mg Oral Daily   montelukast  10 mg Oral QHS   Continuous Infusions:  heparin 1,550 Units/hr (08/28/21 0646)     LOS: 0 days    Time spent: 35 mins     Charise Killian, MD [facility] Pager [phone]  If 7PM-7AM, please contact night-coverage [email or web address] 08/28/2021, 8:37 AM

## 2021-08-28 NOTE — Progress Notes (Signed)
ANTICOAGULATION CONSULT NOTE  Pharmacy Consult for heparin infusion Indication: pulmonary embolus  Allergies  Allergen Reactions   Meningococcal And Conjugated Vaccines Hives    Patient Measurements: Height: 5' (152.4 cm) Weight: 130.2 kg (287 lb) IBW/kg (Calculated) : 50 Heparin Dosing Weight: 82.8 kg  Vital Signs: Temp: 98 F (36.7 C) (08/27 1310) Temp Source: Oral (08/27 1310) BP: 111/70 (08/27 1255) Pulse Rate: 75 (08/27 1310)  Labs: Recent Labs    08/27/21 2140 08/27/21 2357 08/28/21 0545  HGB 12.4*  --  12.2*  HCT 39.2  --  39.0  PLT 275  --  254  APTT  --  33  --   LABPROT  --  13.5  --   INR  --  1.0  --   HEPARINUNFRC  --   --  0.23*  CREATININE 1.06  --  1.08  CKTOTAL 47*  --   --   TROPONINIHS 5 4  --      Estimated Creatinine Clearance: 121.4 mL/min (by C-G formula based on SCr of 1.08 mg/dL).   Medical History: Past Medical History:  Diagnosis Date   Asthma    Down syndrome    Sleep apnea     Assessment: Pt is a 26 yo M w/ hx of Down syndrome, OSA on CPAP, asthma , metabolic syndrome presenting to ED c/o 3-day history of calf pain. Ultrasound was positive for left popliteal DVT extending into the tibial and peroneal calf veins, and chest CT found bilateral significant pulmonary emboli but no right heart strain. CBC shows Hgb stable, Hct stable, PLT stable. Pt not on any anticoagulation PTA. Pt's HL did not increase much after last bolus and rate increase, so re-bolus'ing more aggressively this time.  Goal of Therapy:  Heparin level 0.3-0.7 units/ml Monitor platelets by anticoagulation protocol: Yes  Date Time HL Rate/Comment 8/27 0545 0.23 Subtherapeutic 8/27 1422 0.25 Subtherapeutic  Plan:  Bolus 2000 units x 1 (= 24 mg/kg) Increase heparin infusion to 1750 units/hr Will check HL in 6 hr after rate change CBC daily while on heparin  Terie Purser, PharmD PGY-1 Pharmacy Resident 08/28/2021 3:30 PM

## 2021-08-28 NOTE — ED Notes (Signed)
Unsuccessful attempt x 2 for APTT.Contacted lab to draw, states will send. Pharmacy sent message of delay.

## 2021-08-28 NOTE — ED Notes (Signed)
RT called to place pt on bedtime cpap.

## 2021-08-28 NOTE — ED Notes (Signed)
Pt up at bedside with breakfast, mother at bedside. IV infusing per [facility] will monitor.

## 2021-08-28 NOTE — ED Notes (Signed)
Mother at bedside. Pt with no needs, will monitor.

## 2021-08-29 ENCOUNTER — Inpatient Hospital Stay (HOSPITAL_COMMUNITY)
Admit: 2021-08-29 | Discharge: 2021-08-29 | Disposition: A | Discharge status: Home / Self Care | Payer: Medicaid Other | Attending: Internal Medicine | Admitting: Internal Medicine

## 2021-08-29 ENCOUNTER — Other Ambulatory Visit (HOSPITAL_COMMUNITY): Payer: Self-pay

## 2021-08-29 ENCOUNTER — Telehealth (HOSPITAL_COMMUNITY): Payer: Self-pay | Admitting: Pharmacy Technician

## 2021-08-29 DIAGNOSIS — I2609 Other pulmonary embolism with acute cor pulmonale: Secondary | ICD-10-CM

## 2021-08-29 DIAGNOSIS — I82432 Acute embolism and thrombosis of left popliteal vein: Secondary | ICD-10-CM | POA: Diagnosis not present

## 2021-08-29 DIAGNOSIS — I2699 Other pulmonary embolism without acute cor pulmonale: Secondary | ICD-10-CM | POA: Diagnosis not present

## 2021-08-29 LAB — BASIC METABOLIC PANEL
Anion gap: 9 (ref 5–15)
BUN: 14 mg/dL (ref 6–20)
CO2: 25 mmol/L (ref 22–32)
Calcium: 8.8 mg/dL — ABNORMAL LOW (ref 8.9–10.3)
Chloride: 104 mmol/L (ref 98–111)
Creatinine, Ser: 0.97 mg/dL (ref 0.61–1.24)
GFR, Estimated: >60 mL/min (ref >60)
Glucose, Bld: 112 mg/dL — ABNORMAL HIGH (ref 70–99)
Potassium: 4 mmol/L (ref 3.5–5.1)
Sodium: 138 mmol/L (ref 135–145)

## 2021-08-29 LAB — ECHOCARDIOGRAM COMPLETE
AR max vel: 3.18 cm2
AV Area VTI: 4 cm2
AV Area mean vel: 3.23 cm2
AV Mean grad: 1.5 mmHg
AV Peak grad: 2.4 mmHg
Ao pk vel: 0.77 m/s
Area-P 1/2: 4.49 cm2
Height: 60 in
S' Lateral: 2.8 cm
Weight: 4592 oz

## 2021-08-29 LAB — CBC
HCT: 38.8 % — ABNORMAL LOW (ref 39.0–52.0)
Hemoglobin: 12.6 g/dL — ABNORMAL LOW (ref 13.0–17.0)
MCH: 28.6 pg (ref 26.0–34.0)
MCHC: 32.5 g/dL (ref 30.0–36.0)
MCV: 88 fL (ref 80.0–100.0)
Platelets: 258 10*3/uL (ref 150–400)
RBC: 4.41 MIL/uL (ref 4.22–5.81)
RDW: 14.4 % (ref 11.5–15.5)
WBC: 7.6 10*3/uL (ref 4.0–10.5)
nRBC: 0 % (ref 0.0–0.2)

## 2021-08-29 LAB — HEPARIN LEVEL (UNFRACTIONATED)
Heparin Unfractionated: 0.38 IU/mL (ref 0.30–0.70)
Heparin Unfractionated: 0.42 IU/mL (ref 0.30–0.70)

## 2021-08-29 MED ORDER — APIXABAN 5 MG PO TABS
10.0000 mg | ORAL_TABLET | Freq: Two times a day (BID) | ORAL | Status: DC
  Administered 2021-08-29: 10 mg via ORAL
  Filled 2021-08-29: qty 2

## 2021-08-29 MED ORDER — APIXABAN 5 MG PO TABS
5.0000 mg | ORAL_TABLET | Freq: Two times a day (BID) | ORAL | 0 refills | Status: DC
Start: 2021-09-05 — End: 2021-10-10

## 2021-08-29 MED ORDER — APIXABAN 5 MG PO TABS: 10.0000 mg | ORAL_TABLET | Freq: Two times a day (BID) | ORAL | 0 refills | Status: DC

## 2021-08-29 MED ORDER — APIXABAN 5 MG PO TABS: 5.0000 mg | ORAL_TABLET | Freq: Two times a day (BID) | ORAL | Status: DC

## 2021-08-29 MED ORDER — PERFLUTREN LIPID MICROSPHERE
1.0000 mL | INTRAVENOUS | Status: AC | PRN
  Administered 2021-08-29: 3 mL via INTRAVENOUS

## 2021-08-29 NOTE — TOC Benefit Eligibility Note (Signed)
Patient Product/process development scientist completed.    The patient is currently admitted and upon discharge could be taking Eliquis 5 mg.  The current 30 day co-pay is $4.00.   The patient is insured through [facility]    Roland Earl, CPhT Pharmacy Patient Advocate Specialist [facility] Patient Advocate Team Direct Number: [phone]  Fax: [phone]

## 2021-08-29 NOTE — Progress Notes (Addendum)
ANTICOAGULATION CONSULT NOTE  Pharmacy Consult for heparin infusion Indication: pulmonary embolus  Allergies  Allergen Reactions   Meningococcal And Conjugated Vaccines Hives    Patient Measurements: Height: 5' (152.4 cm) Weight: 130.2 kg (287 lb) IBW/kg (Calculated) : 50 Heparin Dosing Weight: 82.8 kg  Vital Signs: Temp: 98 F (36.7 C) (08/28 0300) Temp Source: Axillary (08/28 0300) BP: 118/68 (08/28 0300) Pulse Rate: 76 (08/28 0300)  Labs: Recent Labs    08/27/21 2140 08/27/21 2140 08/27/21 2357 08/28/21 0545 08/28/21 1422 08/29/21 0114 08/29/21 0815  HGB 12.4*  --   --  12.2*  --  12.6*  --   HCT 39.2  --   --  39.0  --  38.8*  --   PLT 275  --   --  254  --  258  --   APTT  --   --  33  --   --   --   --   LABPROT  --   --  13.5  --   --   --   --   INR  --   --  1.0  --   --   --   --   HEPARINUNFRC  --    < >  --  0.23* 0.25* 0.38 0.42  CREATININE 1.06  --   --  1.08  --  0.97  --   CKTOTAL 47*  --   --   --   --   --   --   TROPONINIHS 5  --  4  --   --   --   --    < > = values in this interval not displayed.     Estimated Creatinine Clearance: 135.2 mL/min (by C-G formula based on SCr of 0.97 mg/dL).   Medical History: Past Medical History:  Diagnosis Date   Asthma    Down syndrome    Sleep apnea     Assessment: Pt is a 26 yo M w/ hx of Down syndrome, OSA on CPAP, asthma , metabolic syndrome presenting to ED c/o 3-day history of calf pain. Ultrasound was positive for left popliteal DVT extending into the tibial and peroneal calf veins, and chest CT found bilateral significant pulmonary emboli but no right heart strain. CBC shows Hgb stable, Hct stable, PLT stable. Pt not on any anticoagulation PTA. Pt's HL did not increase much after last bolus and rate increase, so re-bolus'ing more aggressively this time.  Goal of Therapy:  Heparin level 0.3-0.7 units/ml Monitor platelets by anticoagulation protocol:  Yes  Date Time HL Rate/Comment 8/27 0545 0.23 Subtherapeutic 8/27 1422 0.25 Subtherapeutic 8/28 0114 0.38 Therapeutic x 1 8/29  0815 0.42 Therapeutic x 2  Plan:  Consult placed for Eliquis. Stop heparin drip then given Eliquis 10mg  BID x7 days then 5mg  BID.  Recheck HL daily with AM labs CBC daily while on heparin  Clinical Pharmacist 08/29/2021 10:14 AM

## 2021-08-29 NOTE — Telephone Encounter (Signed)
Pharmacy Patient Advocate Encounter  Insurance verification completed.    The patient is insured through [employer] [employer]   The patient is currently admitted and ran test claims for the following: Eliquis.  Copays and coinsurance results were relayed to Inpatient clinical team.   

## 2021-08-29 NOTE — Discharge Summary (Signed)
Physician Discharge Summary  Jeffrey Hodge XFG:[id number] DOB: 22-May-1995 DOA: 08/27/2021  PCP: Jeffrey Hodge  Admit date: 08/27/2021 Discharge date: 08/29/2021  Admitted From: home  Disposition:  home   Recommendations for Outpatient Follow-up:  Follow up with PCP in 1-2 weeks  Home Health: no  Equipment/Devices:  Discharge Condition: stable  CODE STATUS: full  Diet recommendation: Carb Modified   Brief/Interim Summary: HPI was taken from Jeffrey Hodge: Jeffrey Hodge is a 26 y.o. male with medical history significant of  Down syndrome, OSA on cpap qhs, asthma , metabolic syndrome who presents to ED with complaint of calf pain x 3 days of left leg. Per mother who gives history, patient  symptoms progressed today to point of where his calf pain was affecting his activity.  Patient also per mother noted chest pain when asked.  He has no prior history of clots, and no family history of clots.  Patient notes no n/v/d/dysuria/ sob/cough or fever.    ED Course:  On evaluation in ED patient is found to have DVT as well as B/L PE without heart strain. Case was discussed with on call vascular who notes no need for vascular intervention and solely recommends heparin.    Vitals Afeb, bp 122/73, hr 75, rr 20 , sat 91%  Labs: Wbc: 7.5, hg 12.4, plt 275 ,  NA 141, K 3.5, gly 129, cr 1.06  CK 47 Ce 5 Left lower extremity u/s IMPRESSION: Positive exam for left popliteal DVT extending into the tibial and peroneal calf veins.   CTPA   IMPRESSION: Bilateral significant pulmonary emboli although no right heart strain is noted.   No other focal abnormality is noted   As per Dr. Mayford Hodge 8/27-8/28/23: Pt presented w/ left calf pain and was found to have LLE DVT as well as pulmonary embolism. Pt was initially put on IV heparin. CTA chest and echo did not show evidence of right heart strain. Echo showed EF 55-60%, normal diastolic function, no atrial level shunt was detected. IV  heparin was d/c and pt was transitioned to po eliquis prior to d/c. ADRs of eliquis was explained to pt and pt's mother at bedside. [name] verbalized her understanding. [name] knows not to give the pt any NSAIDs.   Discharge Diagnoses:  Principal Problem:   Pulmonary emboli (HCC)  Acute b/l pulmonary embolism: w/o right heart strain as per CTA chest. D/c IV heparin and transitioned to po eliquis. Echo shows EF 55-60%, normal diastolic function & no evidence of right heart strain. Sedentary lifestyle   Left LE DVT: d/c IV heparin and transition po eliquis. Sedentary lifestyle   Metabolic syndrome: holding home dose of metformin but can restart at d/c. HbA1c is 5.5  OSA: CPAP qhs   Asthma: w/o exacerbation. Continue on bronchodilators  Psoriasis: continue on topical therapy  Morbid obesity: BMI 56.0. Complicates overall care & prognosis   Down's syndrome: continue w/ supportive care   Discharge Instructions  Discharge Instructions     Diet Carb Modified   Complete by: As directed    Discharge instructions   Complete by: As directed    F/u w/ PCP in 1-2 weeks. Will need to continue eliquis for a minimal of 3 months, potentially longer. For refills on eliquis, please ask your PCP.   Increase activity slowly   Complete by: As directed       Allergies as of 08/29/2021       Reactions   Meningococcal And Conjugated Vaccines Hives  Medication List     TAKE these medications    apixaban 5 MG Tabs tablet Commonly known as: ELIQUIS Take 2 tablets (10 mg total) by mouth 2 (two) times daily for 7 days.   apixaban 5 MG Tabs tablet Commonly known as: ELIQUIS Take 1 tablet (5 mg total) by mouth 2 (two) times daily. Only start this script after completing eliquis 10mg  BID x 7 days Start taking on: September 05, 2021   Cetirizine HCl Childrens Alrgy 5 MG/5ML Soln Generic drug: cetirizine HCl Take by mouth daily.   Clobetasol Propionate 0.05 % shampoo Apply  topically.   fluticasone 50 MCG/ACT nasal spray Commonly known as: FLONASE Place 2 sprays into both nostrils daily.   metFORMIN 500 MG tablet Commonly known as: GLUCOPHAGE Take 500 mg by mouth 2 (two) times daily.   montelukast 5 MG chewable tablet Commonly known as: SINGULAIR Chew 10 mg by mouth at bedtime.        Allergies  Allergen Reactions   Meningococcal And Conjugated Vaccines Hives    Consultations:    Procedures/Studies: ECHOCARDIOGRAM COMPLETE  Result Date: 08/29/2021    ECHOCARDIOGRAM REPORT   Patient Name:   Jeffrey Hodge Date of Exam: 08/29/2021 Medical Rec #:  08/31/2021         Height:       60.0 in Accession #:    [id number]        Weight:       287.0 lb Date of Birth:  September 24, 1995          BSA:          2.176 m Patient Age:    26 years          BP:           118/68 mmHg Patient Gender: M                 HR:           76 bpm. Exam Location:  [facility] Procedure: 2D Echo, Cardiac Doppler, Color Doppler and Intracardiac            Opacification Agent Indications:     Pulmonary Embolus I26.09  History:         Patient has no prior history of Echocardiogram examinations.                  Risk Factors:Sleep Apnea and Down syndrome.  Sonographer:     05/08/1996 Referring Phys:  Jeffrey Hodge Diagnosing Phys: [id number] Jeffrey Hodge  Sonographer Comments: Technically difficult study due to poor echo windows, suboptimal parasternal window, suboptimal apical window and no subcostal window. IMPRESSIONS  1. Left ventricular ejection fraction, by estimation, is 55 to 60%. The left ventricle has normal function. Left ventricular endocardial border not optimally defined to evaluate regional wall motion. Left ventricular diastolic parameters were normal.  2. Right ventricular systolic function was not well visualized. The right ventricular size is normal. Tricuspid regurgitation signal is inadequate for assessing PA pressure.  3. The mitral valve is normal in structure. No evidence  of mitral valve regurgitation. No evidence of mitral stenosis.  4. The aortic valve was not well visualized. Aortic valve regurgitation is not visualized. No aortic stenosis is present.  5. Technically difficult study due to poor echo windows, suboptimal parasternal window, suboptimal apical window and no subcostal window. FINDINGS  Left Ventricle: Left ventricular ejection fraction, by estimation, is 55 to 60%. The left ventricle has normal function. Left  ventricular endocardial border not optimally defined to evaluate regional wall motion. Definity contrast agent was given IV to delineate the left ventricular endocardial borders. The left ventricular internal cavity size was normal in size. There is no left ventricular hypertrophy. Left ventricular diastolic parameters were normal. Right Ventricle: The right ventricular size is normal. No increase in right ventricular wall thickness. Right ventricular systolic function was not well visualized. Tricuspid regurgitation signal is inadequate for assessing PA pressure. Left Atrium: Left atrial size was normal in size. Right Atrium: Right atrial size was normal in size. Pericardium: There is no evidence of pericardial effusion. Mitral Valve: The mitral valve is normal in structure. No evidence of mitral valve regurgitation. No evidence of mitral valve stenosis. Tricuspid Valve: The tricuspid valve is normal in structure. Tricuspid valve regurgitation is not demonstrated. No evidence of tricuspid stenosis. Aortic Valve: The aortic valve was not well visualized. Aortic valve regurgitation is not visualized. No aortic stenosis is present. Aortic valve mean gradient measures 1.5 mmHg. Aortic valve peak gradient measures 2.4 mmHg. Aortic valve area, by VTI measures 4.00 cm. Pulmonic Valve: The pulmonic valve was normal in structure. Pulmonic valve regurgitation is not visualized. No evidence of pulmonic stenosis. Aorta: The aortic root is normal in size and structure.  Venous: The inferior vena cava was not well visualized. IAS/Shunts: No atrial level shunt detected by color flow Doppler.  LEFT VENTRICLE PLAX 2D LVIDd:         4.00 cm   Diastology LVIDs:         2.80 cm   LV e' medial:    7.18 cm/s LV PW:         1.00 cm   LV E/e' medial:  11.8 LV IVS:        0.90 cm   LV e' lateral:   7.83 cm/s LVOT diam:     2.00 cm   LV E/e' lateral: 10.8 LV SV:         48 LV SV Index:   22 LVOT Area:     3.14 cm  RIGHT VENTRICLE RV Basal diam:  4.60 cm RV S prime:     12.90 cm/s TAPSE (M-mode): 1.2 cm LEFT ATRIUM             Index        RIGHT ATRIUM           Index LA diam:        3.40 cm 1.56 cm/m   RA Area:     18.60 cm LA Vol (A2C):   42.0 ml 19.30 ml/m  RA Volume:   55.00 ml  25.27 ml/m LA Vol (A4C):   35.2 ml 16.17 ml/m LA Biplane Vol: 38.8 ml 17.83 ml/m  AORTIC VALVE AV Area (Vmax):    3.18 cm AV Area (Vmean):   3.23 cm AV Area (VTI):     4.00 cm AV Vmax:           76.75 cm/s AV Vmean:          51.950 cm/s AV VTI:            0.121 m AV Peak Grad:      2.4 mmHg AV Mean Grad:      1.5 mmHg LVOT Vmax:         77.70 cm/s LVOT Vmean:        53.400 cm/s LVOT VTI:          0.154 m LVOT/AV VTI ratio: 1.27  AORTA Ao Root diam: 2.50 cm MITRAL VALVE               TRICUSPID VALVE MV Area (PHT): 4.49 cm    TR Peak grad:   17.3 mmHg MV Decel Time: 169 msec    TR Vmax:        208.00 cm/s MV E velocity: 84.80 cm/s MV A velocity: 54.00 cm/s  SHUNTS MV E/A ratio:  1.57        Systemic VTI:  0.15 m                            Systemic Diam: 2.00 cm Lorine Bears Jeffrey Hodge Electronically signed by Lorine Bears Jeffrey Hodge Signature Date/Time: 08/29/2021/12:18:22 PM    Final    CT Angio Chest PE W and/or Wo Contrast  Result Date: 08/27/2021 CLINICAL DATA:  Chest pain and shortness of breath with known history of DVT EXAM: CT ANGIOGRAPHY CHEST WITH CONTRAST TECHNIQUE: Multidetector CT imaging of the chest was performed using the standard protocol during bolus administration of intravenous contrast.  Multiplanar CT image reconstructions and MIPs were obtained to evaluate the vascular anatomy. RADIATION DOSE REDUCTION: This exam was performed according to the departmental dose-optimization program which includes automated exposure control, adjustment of the mA and/or kV according to patient size and/or use of iterative reconstruction technique. CONTRAST:  OMNIPAQUE IOHEXOL 350 MG/ML SOLN COMPARISON:  None Available. FINDINGS: Cardiovascular: Thoracic aorta shows a normal branching pattern. No aneurysmal dilatation or dissection is noted. No cardiac enlargement is seen. No coronary calcifications are noted. Pulmonary artery shows a normal branching pattern bilaterally. Lobar pulmonary emboli are noted bilaterally although no right heart strain is seen. Mediastinum/Nodes: Thoracic inlet is within normal limits. No hilar or mediastinal adenopathy is noted. The esophagus is within normal limits. Lungs/Pleura: Lungs are well aerated bilaterally. No focal infiltrate or sizable effusion is seen. No sizable parenchymal nodules are noted. Upper Abdomen: Visualized upper abdomen is unremarkable. Musculoskeletal: Degenerative changes of the thoracic spine are seen. No compression deformities are noted. Review of the MIP images confirms the above findings. IMPRESSION: Bilateral significant pulmonary emboli although no right heart strain is noted. No other focal abnormality is noted. Critical Value/emergent results were called by telephone at the time of interpretation on 08/27/2021 at 10:46 pm to provider Billee Cashing, PA , who verbally acknowledged these results. Electronically Signed   By: Alcide Clever M.D.   On: 08/27/2021 22:48   DG Tibia/Fibula Left  Result Date: 08/27/2021 CLINICAL DATA:  Acute LEFT leg pain for 1 day.  No known injury. EXAM: LEFT TIBIA AND FIBULA - 2 VIEW COMPARISON:  None Available. FINDINGS: No acute fracture or dislocation noted. No focal bony lesions are present. No definite soft tissue  abnormalities are present. IMPRESSION: Negative. Electronically Signed   By: Harmon Pier M.D.   On: 08/27/2021 19:21   US Venous Img Lower Unilateral Left (DVT)  Result Date: 08/27/2021 CLINICAL DATA:  Left lower extremity pain since earlier today with edema EXAM: LEFT LOWER EXTREMITY VENOUS DOPPLER ULTRASOUND TECHNIQUE: Gray-scale sonography with graded compression, as well as color Doppler and duplex ultrasound were performed to evaluate the lower extremity deep venous systems from the level of the common femoral vein and including the common femoral, femoral, profunda femoral, popliteal and calf veins including the posterior tibial, peroneal and gastrocnemius veins when visible. Spectral Doppler was utilized to evaluate flow at rest and with distal augmentation maneuvers in the  common femoral, femoral and popliteal veins. COMPARISON:  None Available. FINDINGS: Contralateral Common Femoral Vein: Respiratory phasicity is normal and symmetric with the symptomatic side. No evidence of thrombus. Normal compressibility. Common Femoral Vein: No evidence of thrombus. Normal compressibility, respiratory phasicity and response to augmentation. Saphenofemoral Junction: No evidence of thrombus. Normal compressibility and flow on color Doppler imaging. Profunda Femoral Vein: No evidence of thrombus. Normal compressibility and flow on color Doppler imaging. Femoral Vein: No evidence of thrombus. Normal compressibility, respiratory phasicity and response to augmentation. Popliteal Vein: Nonocclusive hypoechoic thrombus. Vessel partially compressible. Augmentation not performed. Calf Veins: Thrombus does appear to extend into the calf tibial peroneal veins with limited visualization. IMPRESSION: Positive exam for left popliteal DVT extending into the tibial and peroneal calf veins. Electronically Signed   By: Judie Petit.  Shick M.D.   On: 08/27/2021 18:48   (Echo, Carotid, EGD, Colonoscopy, ERCP)    Subjective: Pt denies any  complaints    Discharge Exam: Vitals:   08/29/21 0900 08/29/21 1200  BP: 120/77 122/80  Pulse: 80 85  Resp: (!) 24 (!) 22  Temp: 98.2 F (36.8 C) 98.4 F (36.9 C)  SpO2: 100% 100%   Vitals:   08/28/21 2315 08/29/21 0300 08/29/21 0900 08/29/21 1200  BP: 128/76 118/68 120/77 122/80  Pulse: 72 76 80 85  Resp: 16 20 (!) 24 (!) 22  Temp: 98.1 F (36.7 C) 98 F (36.7 C) 98.2 F (36.8 C) 98.4 F (36.9 C)  TempSrc: Oral Axillary Oral Oral  SpO2: 98% 99% 100% 100%  Weight:      Height:        General: Pt is alert, awake, not in acute distress. Morbidly obese Cardiovascular: S1/S2 +, no rubs, no gallops Respiratory: CTA bilaterally, no wheezing, no rhonchi Abdominal: Soft, NT, obese, bowel sounds + Extremities: no cyanosis    The results of significant diagnostics from this hospitalization (including imaging, microbiology, ancillary and laboratory) are listed below for reference.     Microbiology: No results found for this or any previous visit (from the past 240 hour(s)).   Labs: BNP (last 3 results) No results for input(s): "BNP" in the last 8760 hours. Basic Metabolic Panel: Recent Labs  Lab 08/27/21 2140 08/28/21 0545 08/29/21 0114  NA 141 140 138  K 3.5 3.9 4.0  CL 105 108 104  CO2 27 27 25   GLUCOSE 129* 106* 112*  BUN 12 13 14   CREATININE 1.06 1.08 0.97  CALCIUM 9.0 8.9 8.8*   Liver Function Tests: Recent Labs  Lab 08/27/21 2140 08/28/21 0545  AST 16 13*  ALT 11 9  ALKPHOS 79 77  BILITOT 1.2 1.0  PROT 7.4 7.0  ALBUMIN 3.6 3.4*   No results for input(s): "LIPASE", "AMYLASE" in the last 168 hours. No results for input(s): "AMMONIA" in the last 168 hours. CBC: Recent Labs  Lab 08/27/21 2140 08/28/21 0545 08/29/21 0114  WBC 7.5 6.2 7.6  NEUTROABS 5.3  --   --   HGB 12.4* 12.2* 12.6*  HCT 39.2 39.0 38.8*  MCV 90.7 90.3 88.0  PLT 275 254 258   Cardiac Enzymes: Recent Labs  Lab 08/27/21 2140  CKTOTAL 47*   BNP: Invalid input(s):  "POCBNP" CBG: No results for input(s): "GLUCAP" in the last 168 hours. D-Dimer No results for input(s): "DDIMER" in the last 72 hours. Hgb A1c Recent Labs    08/28/21 0545  HGBA1C 5.5   Lipid Profile No results for input(s): "CHOL", "HDL", "LDLCALC", "TRIG", "CHOLHDL", "LDLDIRECT" in the  last 72 hours. Thyroid function studies No results for input(s): "TSH", "T4TOTAL", "T3FREE", "THYROIDAB" in the last 72 hours.  Invalid input(s): "FREET3" Anemia work up No results for input(s): "VITAMINB12", "FOLATE", "FERRITIN", "TIBC", "IRON", "RETICCTPCT" in the last 72 hours. Urinalysis    Component Value Date/Time   COLORURINE Yellow 06/26/2013 1940   APPEARANCEUR Clear 06/26/2013 1940   LABSPEC 1.015 06/26/2013 1940   PHURINE 5.0 06/26/2013 1940   GLUCOSEU Negative 06/26/2013 1940   HGBUR Negative 06/26/2013 1940   BILIRUBINUR Negative 06/26/2013 1940   KETONESUR Negative 06/26/2013 1940   PROTEINUR Negative 06/26/2013 1940   NITRITE Negative 06/26/2013 1940   LEUKOCYTESUR Negative 06/26/2013 1940   Sepsis Labs Recent Labs  Lab 08/27/21 2140 08/28/21 0545 08/29/21 0114  WBC 7.5 6.2 7.6   Microbiology No results found for this or any previous visit (from the past 240 hour(s)).   Time coordinating discharge: Over 30 minutes  SIGNED:   Charise KillianJamiese M Joeph Szatkowski, Jeffrey Hodge  [facility] 08/29/2021, 2:03 PM Pager   If 7PM-7AM, please contact night-coverage [email or web address]

## 2021-08-29 NOTE — Progress Notes (Signed)
ANTICOAGULATION CONSULT NOTE  Pharmacy Consult for heparin infusion Indication: pulmonary embolus  Allergies  Allergen Reactions   Meningococcal And Conjugated Vaccines Hives    Patient Measurements: Height: 5' (152.4 cm) Weight: 130.2 kg (287 lb) IBW/kg (Calculated) : 50 Heparin Dosing Weight: 82.8 kg  Vital Signs: Temp: 98.2 F (36.8 C) (08/27 1903) Temp Source: Oral (08/27 1903) BP: 124/70 (08/27 1903) Pulse Rate: 73 (08/27 1903)  Labs: Recent Labs    08/27/21 2140 08/27/21 2357 08/28/21 0545 08/28/21 1422 08/29/21 0114  HGB 12.4*  --  12.2*  --  12.6*  HCT 39.2  --  39.0  --  38.8*  PLT 275  --  254  --  258  APTT  --  33  --   --   --   LABPROT  --  13.5  --   --   --   INR  --  1.0  --   --   --   HEPARINUNFRC  --   --  0.23* 0.25* 0.38  CREATININE 1.06  --  1.08  --  0.97  CKTOTAL 47*  --   --   --   --   TROPONINIHS 5 4  --   --   --      Estimated Creatinine Clearance: 135.2 mL/min (by C-G formula based on SCr of 0.97 mg/dL).   Medical History: Past Medical History:  Diagnosis Date   Asthma    Down syndrome    Sleep apnea     Assessment: Pt is a 26 yo M w/ hx of Down syndrome, OSA on CPAP, asthma , metabolic syndrome presenting to ED c/o 3-day history of calf pain. Ultrasound was positive for left popliteal DVT extending into the tibial and peroneal calf veins, and chest CT found bilateral significant pulmonary emboli but no right heart strain. CBC shows Hgb stable, Hct stable, PLT stable. Pt not on any anticoagulation PTA. Pt's HL did not increase much after last bolus and rate increase, so re-bolus'ing more aggressively this time.  Goal of Therapy:  Heparin level 0.3-0.7 units/ml Monitor platelets by anticoagulation protocol: Yes  Date Time HL Rate/Comment 8/27 0545 0.23 Subtherapeutic 8/27 1422 0.25 Subtherapeutic 8/28 0114 0.38 Therapeutic x 1  Plan:  Continue heparin infusion at 1750 units/hr Recheck HL in 6 hr to confirm, then  daily CBC daily while on heparin  Otelia Sergeant, PharmD, [facility] 08/29/2021 2:23 AM

## 2021-08-29 NOTE — Progress Notes (Signed)
*  PRELIMINARY RESULTS* Echocardiogram 2D Echocardiogram has been performed.  Cristela Blue 08/29/2021, 10:42 AM

## 2021-09-02 IMAGING — DX DG HAND 2V*L*
2 series · 2 of 2 positions shown · non-contrast
Comparison: None.

CLINICAL DATA: Dog bite.

EXAM:
LEFT HAND - 2 VIEW

[hand ap]
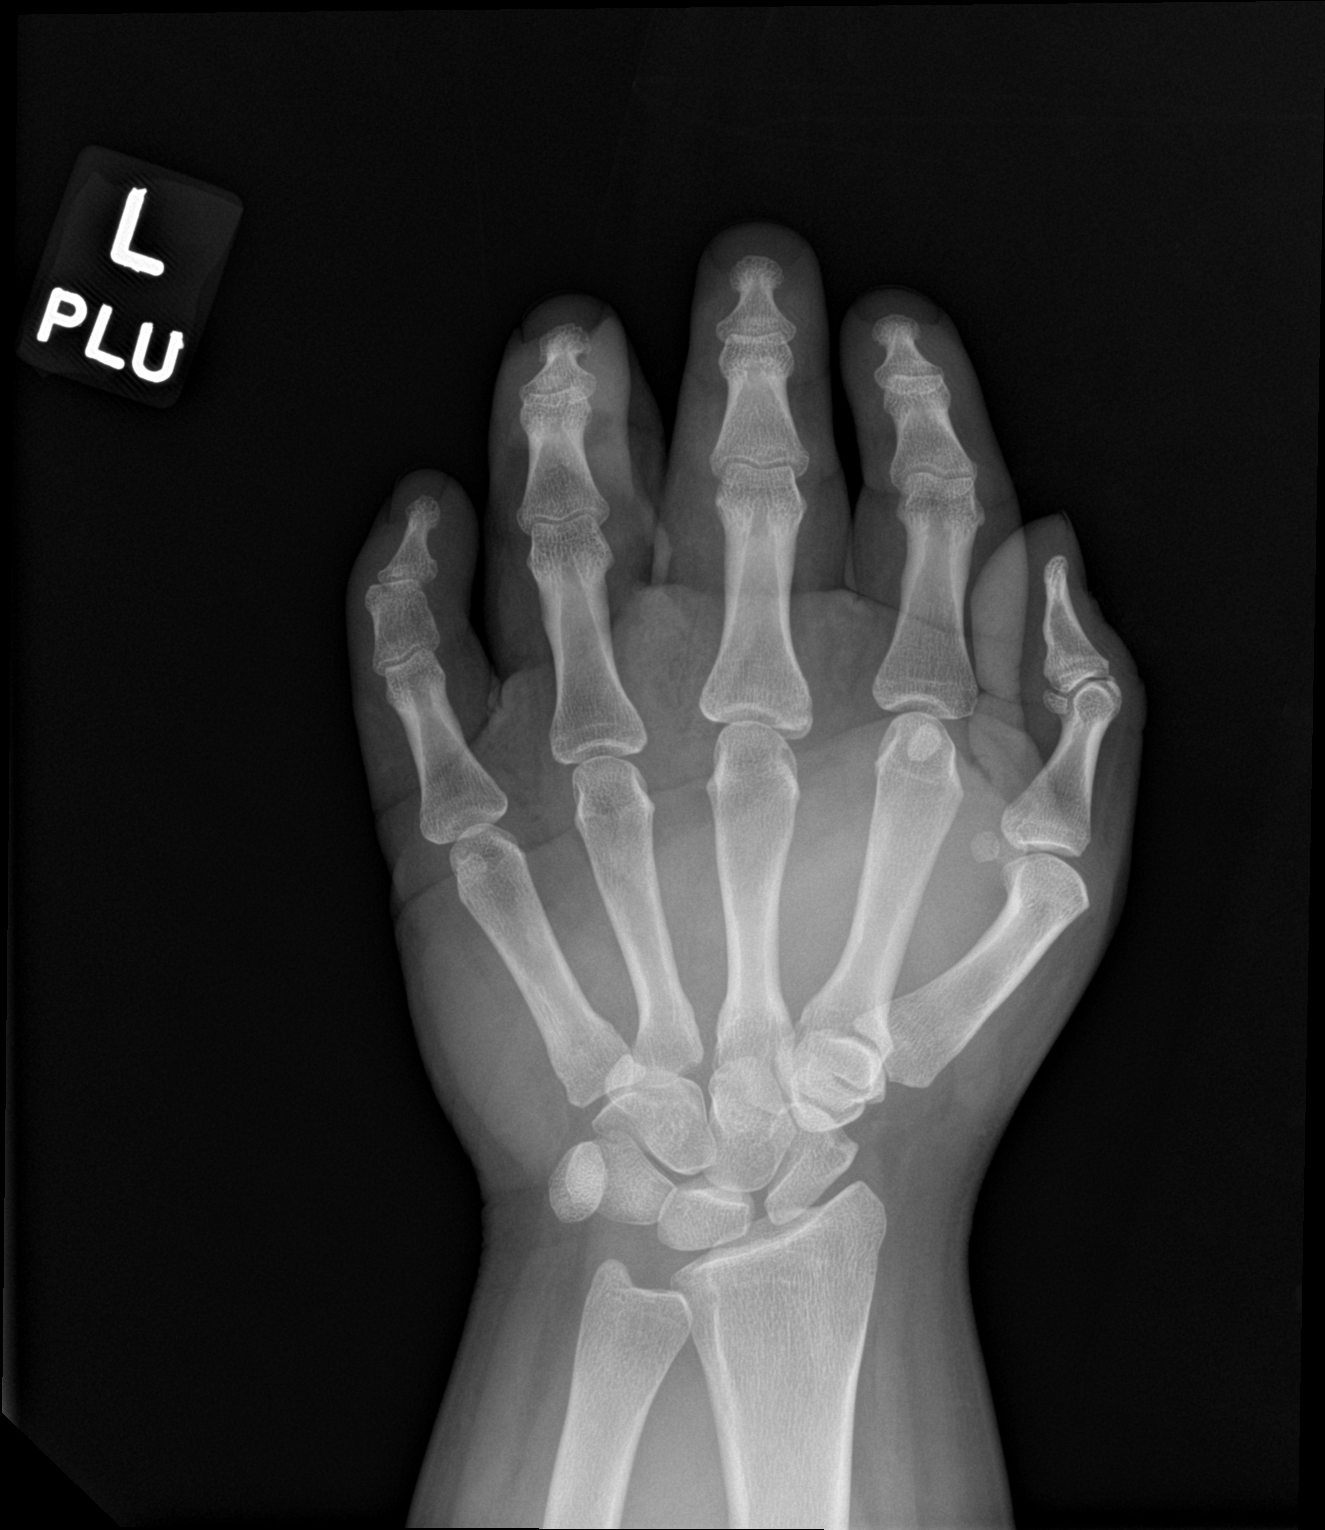

[hand lat]
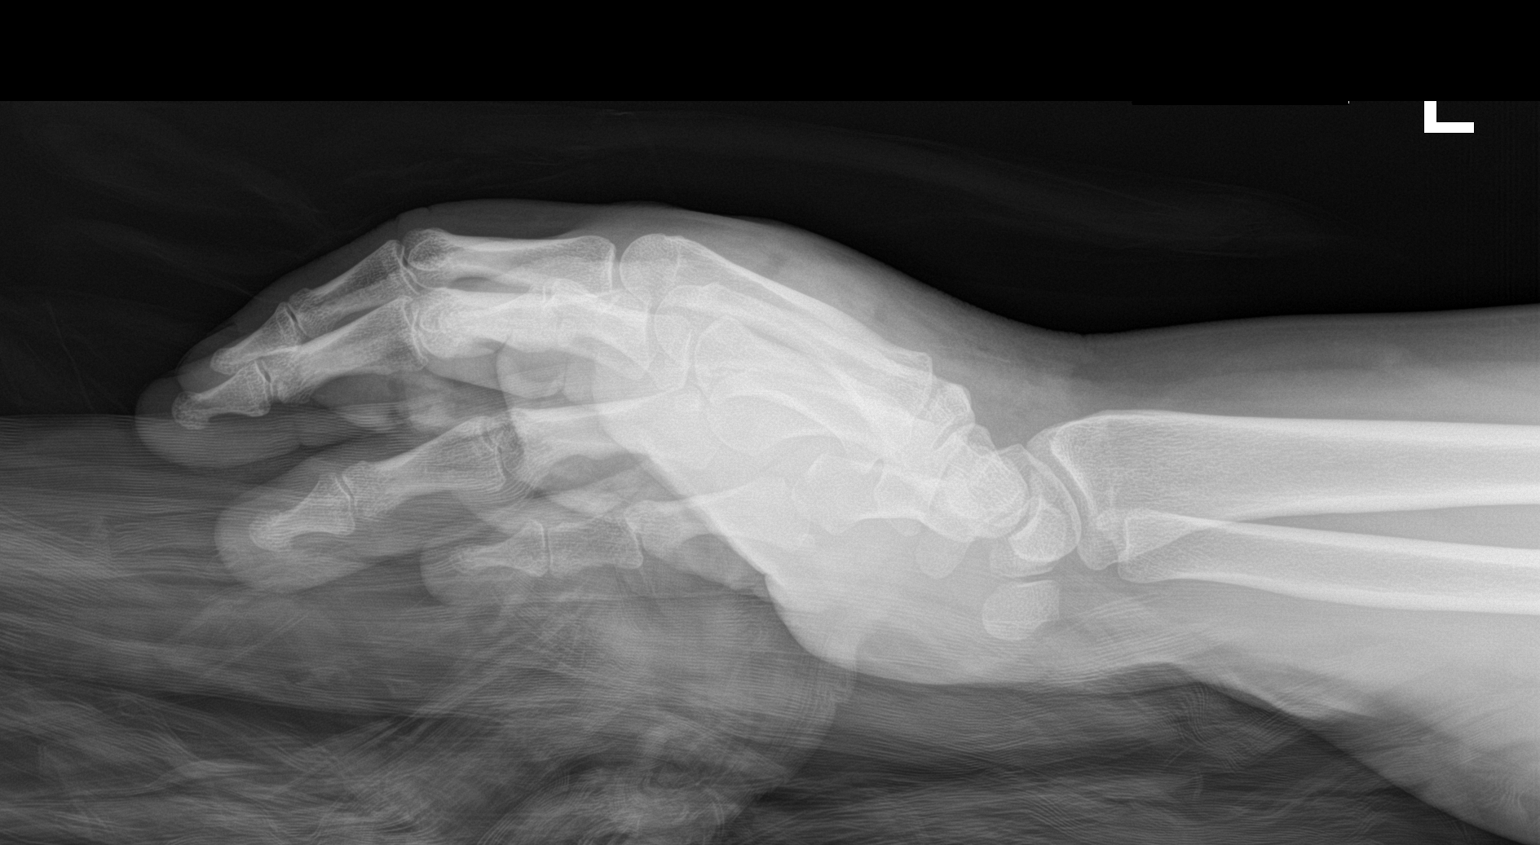

[2 of 2 positions shown; findings below may reference images not displayed]

FINDINGS: There is no evidence of fracture or dislocation. There is no
evidence of arthropathy or other focal bone abnormality. Soft tissue
edema noted about the mid metacarpal phalangeal joints. No
radiopaque foreign body.
IMPRESSION: Soft tissue edema. No radiopaque foreign body or osseous
abnormality.

## 2021-09-15 ENCOUNTER — Inpatient Hospital Stay: Payer: Medicaid Other | Admitting: Internal Medicine

## 2021-09-15 ENCOUNTER — Inpatient Hospital Stay: Payer: Medicaid Other

## 2021-09-19 ENCOUNTER — Inpatient Hospital Stay: Payer: Medicaid Other

## 2021-09-19 ENCOUNTER — Inpatient Hospital Stay: Payer: Medicaid Other | Admitting: Internal Medicine

## 2021-09-22 ENCOUNTER — Encounter: Payer: Self-pay | Admitting: Internal Medicine

## 2021-09-22 ENCOUNTER — Inpatient Hospital Stay: Payer: Medicaid Other | Attending: Internal Medicine | Admitting: Internal Medicine

## 2021-09-22 ENCOUNTER — Inpatient Hospital Stay: Payer: Medicaid Other

## 2021-09-22 VITALS — BP 105/70 | HR 81 | Temp 97.7°F | Resp 18 | Wt 277.6 lb

## 2021-09-22 DIAGNOSIS — G4733 Obstructive sleep apnea (adult) (pediatric): Secondary | ICD-10-CM

## 2021-09-22 DIAGNOSIS — I82432 Acute embolism and thrombosis of left popliteal vein: Secondary | ICD-10-CM | POA: Insufficient documentation

## 2021-09-22 DIAGNOSIS — J45909 Unspecified asthma, uncomplicated: Secondary | ICD-10-CM | POA: Insufficient documentation

## 2021-09-22 DIAGNOSIS — Z7901 Long term (current) use of anticoagulants: Secondary | ICD-10-CM | POA: Diagnosis not present

## 2021-09-22 DIAGNOSIS — Q909 Down syndrome, unspecified: Secondary | ICD-10-CM | POA: Insufficient documentation

## 2021-09-22 DIAGNOSIS — E669 Obesity, unspecified: Secondary | ICD-10-CM | POA: Diagnosis not present

## 2021-09-22 DIAGNOSIS — I2699 Other pulmonary embolism without acute cor pulmonale: Secondary | ICD-10-CM | POA: Diagnosis not present

## 2021-09-22 DIAGNOSIS — G4730 Sleep apnea, unspecified: Secondary | ICD-10-CM | POA: Diagnosis not present

## 2021-09-22 DIAGNOSIS — Z6841 Body Mass Index (BMI) 40.0 and over, adult: Secondary | ICD-10-CM | POA: Diagnosis not present

## 2021-09-22 LAB — ANTITHROMBIN III: AntiThromb III Func: 92 % (ref 75–120)

## 2021-09-22 NOTE — Progress Notes (Addendum)
[facility]  Telephone:(336) [phone] Fax:(336) [phone]  ID: Jeffrey GasterHouston D Hodge OB: 08/07/1995  MR#: [id number]  HQI#:[id number]#:[id number]  Patient Care Team: Jeffrey Hodge, Jeffrey Hodge as PCP - General  REFERRING PROVIDER: Carren Ranganielle Carter, PA  REASON FOR REFERRAL: unprovoked DVT and PE  HPI: Jeffrey Hodge is a 26 y.o. male with past medical history of Down syndrome, asthma and sleep apnea was referred to hematology clinic for anticoagulation recommendation for unprovoked PE and DVT.  History provided by mother.  Patient was admitted from 08/27/2021 to 08/29/2021 at [facility] for left calf pain for 3 days.  Venous Doppler showed left popliteal DVT extending into the tibial and peroneal cough points.  CTA chest showed lobar pulmonary emboli bilaterally with no right heart strain.  He was started on IV heparin and transition to Eliquis on discharge.  Denies any prior personal history of clot.  Denies any family history of clot.  Denies long distance travel, recent surgery or trauma.  He is tolerating Eliquis well.  Patient denies fever, chills, nausea, vomiting, shortness of breath, cough, abdominal pain, bleeding, bowel or bladder issues.  REVIEW OF SYSTEMS:   ROS  As per HPI. Otherwise, a complete review of systems is negative.  PAST MEDICAL HISTORY: Past Medical History:  Diagnosis Date   Asthma    Down syndrome    Sleep apnea     PAST SURGICAL HISTORY: Past Surgical History:  Procedure Laterality Date   I & D EXTREMITY Left 06/21/2020   Procedure: IRRIGATION AND DEBRIDEMENT LEFT UPPER EXTREMITY;  Surgeon: Jeffrey ApleyMurphy, Jeffrey D, MD;  Location: [facility] OR;  Service: Orthopedics;  Laterality: Left;   TONSILLECTOMY      FAMILY HISTORY: History reviewed. No pertinent family history.  HEALTH MAINTENANCE: Social History   Tobacco Use   Smoking status: Never    Passive exposure: Never   Smokeless tobacco: Never  Vaping Use   Vaping Use: Never used  Substance Use Topics    Alcohol use: No   Drug use: No     Allergies  Allergen Reactions   Meningococcal And Conjugated Vaccines Hives    Current Outpatient Medications  Medication Sig Dispense Refill   apixaban (ELIQUIS) 5 MG TABS tablet Take 1 tablet (5 mg total) by mouth 2 (two) times daily. Only start this script after completing eliquis 10mg  BID x 7 days 60 tablet 0   cetirizine HCl (CETIRIZINE HCL CHILDRENS ALRGY) 5 MG/5ML SOLN Take by mouth daily.     Clobetasol Propionate 0.05 % shampoo Apply topically.     fluticasone (FLONASE) 50 MCG/ACT nasal spray Place 2 sprays into both nostrils daily.  5   metFORMIN (GLUCOPHAGE) 500 MG tablet Take 500 mg by mouth 2 (two) times daily.     montelukast (SINGULAIR) 5 MG chewable tablet Chew 10 mg by mouth at bedtime.     No current facility-administered medications for this visit.    OBJECTIVE: Vitals:   09/22/21 1407  BP: 105/70  Pulse: 81  Resp: 18  Temp: 97.7 F (36.5 C)     Body mass index is 54.22 kg/m.      General: Well-developed, well-nourished, no acute distress. Eyes: Pink conjunctiva, anicteric sclera. HEENT: Normocephalic, moist mucous membranes, clear oropharnyx. Lungs: Clear to auscultation bilaterally. Heart: Regular rate and rhythm. No rubs, murmurs, or gallops. Abdomen: Soft, nontender, nondistended. No organomegaly noted, normoactive bowel sounds. Musculoskeletal: No edema, cyanosis, or clubbing. Neuro: Alert, answering all questions appropriately. Cranial nerves grossly intact. Skin: No rashes or petechiae noted. Psych: Normal  affect. Lymphatics: No cervical, calvicular, axillary or inguinal LAD.   LAB RESULTS:  Lab Results  Component Value Date   NA 138 08/29/2021   K 4.0 08/29/2021   CL 104 08/29/2021   CO2 25 08/29/2021   GLUCOSE 112 (H) 08/29/2021   BUN 14 08/29/2021   CREATININE 0.97 08/29/2021   CALCIUM 8.8 (L) 08/29/2021   PROT 7.0 08/28/2021   ALBUMIN 3.4 (L) 08/28/2021   AST 13 (L) 08/28/2021   ALT 9  08/28/2021   ALKPHOS 77 08/28/2021   BILITOT 1.0 08/28/2021   GFRNONAA >60 08/29/2021    Lab Results  Component Value Date   WBC 7.6 08/29/2021   NEUTROABS 5.3 08/27/2021   HGB 12.6 (L) 08/29/2021   HCT 38.8 (L) 08/29/2021   MCV 88.0 08/29/2021   PLT 258 08/29/2021    No results found for: "TIBC", "FERRITIN", "IRONPCTSAT"   STUDIES: ECHOCARDIOGRAM COMPLETE  Result Date: 08/29/2021    ECHOCARDIOGRAM REPORT   Patient Name:   Jeffrey Hodge Date of Exam: 08/29/2021 Medical Rec #:  [id number]         Height:       60.0 in Accession #:    [id number]        Weight:       287.0 lb Date of Birth:  1995/08/30          BSA:          2.176 m Patient Age:    26 years          BP:           118/68 mmHg Patient Gender: M                 HR:           76 bpm. Exam Location:  [facility] Procedure: 2D Echo, Cardiac Doppler, Color Doppler and Intracardiac            Opacification Agent Indications:     Pulmonary Embolus I26.09  History:         Patient has no prior history of Echocardiogram examinations.                  Risk Factors:Sleep Apnea and Down syndrome.  Sonographer:     Sherrie Sport Referring Phys:  [id number] SARA-MAIZ A THOMAS Diagnosing Phys: Jeffrey Sacramento MD  Sonographer Comments: Technically difficult study due to poor echo windows, suboptimal parasternal window, suboptimal apical window and no subcostal window. IMPRESSIONS  1. Left ventricular ejection fraction, by estimation, is 55 to 60%. The left ventricle has normal function. Left ventricular endocardial border not optimally defined to evaluate regional wall motion. Left ventricular diastolic parameters were normal.  2. Right ventricular systolic function was not well visualized. The right ventricular size is normal. Tricuspid regurgitation signal is inadequate for assessing PA pressure.  3. The mitral valve is normal in structure. No evidence of mitral valve regurgitation. No evidence of mitral stenosis.  4. The aortic valve was not well visualized.  Aortic valve regurgitation is not visualized. No aortic stenosis is present.  5. Technically difficult study due to poor echo windows, suboptimal parasternal window, suboptimal apical window and no subcostal window. FINDINGS  Left Ventricle: Left ventricular ejection fraction, by estimation, is 55 to 60%. The left ventricle has normal function. Left ventricular endocardial border not optimally defined to evaluate regional wall motion. Definity contrast agent was given IV to delineate the left ventricular endocardial borders. The left ventricular internal cavity size was  normal in size. There is no left ventricular hypertrophy. Left ventricular diastolic parameters were normal. Right Ventricle: The right ventricular size is normal. No increase in right ventricular wall thickness. Right ventricular systolic function was not well visualized. Tricuspid regurgitation signal is inadequate for assessing PA pressure. Left Atrium: Left atrial size was normal in size. Right Atrium: Right atrial size was normal in size. Pericardium: There is no evidence of pericardial effusion. Mitral Valve: The mitral valve is normal in structure. No evidence of mitral valve regurgitation. No evidence of mitral valve stenosis. Tricuspid Valve: The tricuspid valve is normal in structure. Tricuspid valve regurgitation is not demonstrated. No evidence of tricuspid stenosis. Aortic Valve: The aortic valve was not well visualized. Aortic valve regurgitation is not visualized. No aortic stenosis is present. Aortic valve mean gradient measures 1.5 mmHg. Aortic valve peak gradient measures 2.4 mmHg. Aortic valve area, by VTI measures 4.00 cm. Pulmonic Valve: The pulmonic valve was normal in structure. Pulmonic valve regurgitation is not visualized. No evidence of pulmonic stenosis. Aorta: The aortic root is normal in size and structure. Venous: The inferior vena cava was not well visualized. IAS/Shunts: No atrial level shunt detected by color flow  Doppler.  LEFT VENTRICLE PLAX 2D LVIDd:         4.00 cm   Diastology LVIDs:         2.80 cm   LV e' medial:    7.18 cm/s LV PW:         1.00 cm   LV E/e' medial:  11.8 LV IVS:        0.90 cm   LV e' lateral:   7.83 cm/s LVOT diam:     2.00 cm   LV E/e' lateral: 10.8 LV SV:         48 LV SV Index:   22 LVOT Area:     3.14 cm  RIGHT VENTRICLE RV Basal diam:  4.60 cm RV S prime:     12.90 cm/s TAPSE (M-mode): 1.2 cm LEFT ATRIUM             Index        RIGHT ATRIUM           Index LA diam:        3.40 cm 1.56 cm/m   RA Area:     18.60 cm LA Vol (A2C):   42.0 ml 19.30 ml/m  RA Volume:   55.00 ml  25.27 ml/m LA Vol (A4C):   35.2 ml 16.17 ml/m LA Biplane Vol: 38.8 ml 17.83 ml/m  AORTIC VALVE AV Area (Vmax):    3.18 cm AV Area (Vmean):   3.23 cm AV Area (VTI):     4.00 cm AV Vmax:           76.75 cm/s AV Vmean:          51.950 cm/s AV VTI:            0.121 m AV Peak Grad:      2.4 mmHg AV Mean Grad:      1.5 mmHg LVOT Vmax:         77.70 cm/s LVOT Vmean:        53.400 cm/s LVOT VTI:          0.154 m LVOT/AV VTI ratio: 1.27  AORTA Ao Root diam: 2.50 cm MITRAL VALVE               TRICUSPID VALVE MV Area (PHT): 4.49 cm  TR Peak grad:   17.3 mmHg MV Decel Time: 169 msec    TR Vmax:        208.00 cm/s MV E velocity: 84.80 cm/s MV A velocity: 54.00 cm/s  SHUNTS MV E/A ratio:  1.57        Systemic VTI:  0.15 m                            Systemic Diam: 2.00 cm Jeffrey Bears MD Electronically signed by Jeffrey Bears MD Signature Date/Time: 08/29/2021/12:18:22 PM    Final    CT Angio Chest PE W and/or Wo Contrast  Result Date: 08/27/2021 CLINICAL DATA:  Chest pain and shortness of breath with known history of DVT EXAM: CT ANGIOGRAPHY CHEST WITH CONTRAST TECHNIQUE: Multidetector CT imaging of the chest was performed using the standard protocol during bolus administration of intravenous contrast. Multiplanar CT image reconstructions and MIPs were obtained to evaluate the vascular anatomy. RADIATION DOSE REDUCTION:  This exam was performed according to the departmental dose-optimization program which includes automated exposure control, adjustment of the mA and/or kV according to patient size and/or use of iterative reconstruction technique. CONTRAST:  OMNIPAQUE IOHEXOL 350 MG/ML SOLN COMPARISON:  None Available. FINDINGS: Cardiovascular: Thoracic aorta shows a normal branching pattern. No aneurysmal dilatation or dissection is noted. No cardiac enlargement is seen. No coronary calcifications are noted. Pulmonary artery shows a normal branching pattern bilaterally. Lobar pulmonary emboli are noted bilaterally although no right heart strain is seen. Mediastinum/Nodes: Thoracic inlet is within normal limits. No hilar or mediastinal adenopathy is noted. The esophagus is within normal limits. Lungs/Pleura: Lungs are well aerated bilaterally. No focal infiltrate or sizable effusion is seen. No sizable parenchymal nodules are noted. Upper Abdomen: Visualized upper abdomen is unremarkable. Musculoskeletal: Degenerative changes of the thoracic spine are seen. No compression deformities are noted. Review of the MIP images confirms the above findings. IMPRESSION: Bilateral significant pulmonary emboli although no right heart strain is noted. No other focal abnormality is noted. Critical Value/emergent results were called by telephone at the time of interpretation on 08/27/2021 at 10:46 pm to provider Billee Cashing, PA , who verbally acknowledged these results. Electronically Signed   By: Alcide Clever M.D.   On: 08/27/2021 22:48   DG Tibia/Fibula Left  Result Date: 08/27/2021 CLINICAL DATA:  Acute LEFT leg pain for 1 day.  No known injury. EXAM: LEFT TIBIA AND FIBULA - 2 VIEW COMPARISON:  None Available. FINDINGS: No acute fracture or dislocation noted. No focal bony lesions are present. No definite soft tissue abnormalities are present. IMPRESSION: Negative. Electronically Signed   By: Harmon Pier M.D.   On: 08/27/2021 19:21    US Venous Img Lower Unilateral Left (DVT)  Result Date: 08/27/2021 CLINICAL DATA:  Left lower extremity pain since earlier today with edema EXAM: LEFT LOWER EXTREMITY VENOUS DOPPLER ULTRASOUND TECHNIQUE: Gray-scale sonography with graded compression, as well as color Doppler and duplex ultrasound were performed to evaluate the lower extremity deep venous systems from the level of the common femoral vein and including the common femoral, femoral, profunda femoral, popliteal and calf veins including the posterior tibial, peroneal and gastrocnemius veins when visible. Spectral Doppler was utilized to evaluate flow at rest and with distal augmentation maneuvers in the common femoral, femoral and popliteal veins. COMPARISON:  None Available. FINDINGS: Contralateral Common Femoral Vein: Respiratory phasicity is normal and symmetric with the symptomatic side. No evidence of thrombus. Normal compressibility. Common  Femoral Vein: No evidence of thrombus. Normal compressibility, respiratory phasicity and response to augmentation. Saphenofemoral Junction: No evidence of thrombus. Normal compressibility and flow on color Doppler imaging. Profunda Femoral Vein: No evidence of thrombus. Normal compressibility and flow on color Doppler imaging. Femoral Vein: No evidence of thrombus. Normal compressibility, respiratory phasicity and response to augmentation. Popliteal Vein: Nonocclusive hypoechoic thrombus. Vessel partially compressible. Augmentation not performed. Calf Veins: Thrombus does appear to extend into the calf tibial peroneal veins with limited visualization. IMPRESSION: Positive exam for left popliteal DVT extending into the tibial and peroneal calf veins. Electronically Signed   By: Judie Petit.  Shick M.D.   On: 08/27/2021 18:48    ASSESSMENT AND PLAN:   Nobuo D Chew is a 26 y.o. male with pmh of Down syndrome, asthma and sleep apnea was referred to hematology clinic for anticoagulation recommendation for  unprovoked PE and DVT.  # Left popliteal DVT #Acute bilateral lobar PE, unprovoked  -Diagnosed on 08/27/2021 after left calf pain for 3 days. -Denies any provoking factor.  He has obesity with BMI of 54.  But obesity is a weak risk factor for clot. - on Eliquis 5 mg BID -Discussed with the mother in detail.  We will obtain hypercoagulable work-up to look for any thrombophilia.  Usually males with unprovoked DVT/PE are at a higher risk of recurrence of 30% in the next 5 years.  As per the current ASH guidelines, the recommendation is for long-term anticoagulation as long as the patient is tolerating well without any complications and is not interfering with his quality of life.  Mother denied any issues with anticoagulation and that he is not involved in activities that would put him at higher risk of falls. She was okay with long term anticoagulation.   Orders Placed This Encounter  Procedures   Antithrombin III   Protein C activity   Protein C, total   Protein S activity   Protein S, total   Lupus anticoagulant panel   Beta-2-glycoprotein i abs, IgG/M/A   Factor 5 leiden   Prothrombin gene mutation   Cardiolipin antibodies, IgG, IgM, IgA   RTC in 3 weeks - televideo to discuss results.   Patient expressed understanding and was in agreement with this plan. He also understands that He can call clinic at any time with any questions, concerns, or complaints.   I spent a total of 45 minutes reviewing chart data, face-to-face evaluation with the patient, counseling and coordination of care as detailed above.  Michaelyn Barter, MD   09/22/2021 2:24 PM

## 2021-09-23 ENCOUNTER — Encounter: Payer: Self-pay | Admitting: Internal Medicine

## 2021-09-23 DIAGNOSIS — I82409 Acute embolism and thrombosis of unspecified deep veins of unspecified lower extremity: Secondary | ICD-10-CM | POA: Insufficient documentation

## 2021-09-23 LAB — BETA-2-GLYCOPROTEIN I ABS, IGG/M/A
Beta-2 Glyco I IgG: <9 GPI IgG units (ref 0–20)
Beta-2-Glycoprotein I IgA: <9 GPI IgA units (ref 0–25)
Beta-2-Glycoprotein I IgM: <9 GPI IgM units (ref 0–32)

## 2021-09-23 LAB — PROTEIN C, TOTAL: Protein C, Total: 116 % (ref 60–150)

## 2021-09-24 LAB — CARDIOLIPIN ANTIBODIES, IGG, IGM, IGA
Anticardiolipin IgA: <9 APL U/mL (ref 0–11)
Anticardiolipin IgG: <9 GPL U/mL (ref 0–14)
Anticardiolipin IgM: <9 MPL U/mL (ref 0–12)

## 2021-09-26 LAB — PROTEIN C ACTIVITY: Protein C Activity: 138 % (ref 73–180)

## 2021-09-26 LAB — PROTEIN S, TOTAL: Protein S Ag, Total: 118 % (ref 60–150)

## 2021-09-26 LAB — LUPUS ANTICOAGULANT PANEL
DRVVT: 76.8 s — ABNORMAL HIGH (ref 0.0–47.0)
PTT Lupus Anticoagulant: 44.8 s — ABNORMAL HIGH (ref 0.0–43.5)

## 2021-09-26 LAB — PTT-LA MIX: PTT-LA Mix: 41.8 s — ABNORMAL HIGH (ref 0.0–40.5)

## 2021-09-26 LAB — DRVVT CONFIRM: dRVVT Confirm: 1.3 ratio — ABNORMAL HIGH (ref 0.8–1.2)

## 2021-09-26 LAB — HEXAGONAL PHASE PHOSPHOLIPID: Hexagonal Phase Phospholipid: 2 s (ref 0–11)

## 2021-09-26 LAB — DRVVT MIX: dRVVT Mix: 58.3 s — ABNORMAL HIGH (ref 0.0–40.4)

## 2021-09-26 LAB — PROTEIN S ACTIVITY: Protein S Activity: 125 % (ref 63–140)

## 2021-09-28 LAB — FACTOR 5 LEIDEN

## 2021-09-28 LAB — PROTHROMBIN GENE MUTATION

## 2021-10-10 ENCOUNTER — Encounter: Payer: Self-pay | Admitting: Internal Medicine

## 2021-10-10 ENCOUNTER — Other Ambulatory Visit: Payer: Self-pay | Admitting: *Deleted

## 2021-10-10 MED ORDER — APIXABAN 5 MG PO TABS: 5.0000 mg | ORAL_TABLET | Freq: Two times a day (BID) | ORAL | 0 refills | Status: DC

## 2021-10-13 ENCOUNTER — Inpatient Hospital Stay: Payer: Medicaid Other | Attending: Internal Medicine | Admitting: Internal Medicine

## 2021-10-13 ENCOUNTER — Encounter: Payer: Self-pay | Admitting: Internal Medicine

## 2021-10-13 DIAGNOSIS — I2699 Other pulmonary embolism without acute cor pulmonale: Secondary | ICD-10-CM | POA: Diagnosis not present

## 2021-10-13 DIAGNOSIS — I82432 Acute embolism and thrombosis of left popliteal vein: Secondary | ICD-10-CM

## 2021-10-13 MED ORDER — APIXABAN 5 MG PO TABS: 5.0000 mg | ORAL_TABLET | Freq: Two times a day (BID) | ORAL | 2 refills | Status: DC

## 2021-10-13 NOTE — Progress Notes (Addendum)
[facility]  Telephone:([phone] Fax:([phone] I connected with  Jeffrey Hodge on 10/13/21 by a video enabled telemedicine application and verified that I am speaking with the correct person using two identifiers.   I discussed the limitations of evaluation and management by telemedicine. The patient expressed understanding and agreed to proceed.   ID: Jeffrey Hodge OB: [birth year]  MR#: [id number]  ION#:[id number]  Patient Care Team: Nira Retort as PCP - General  REFERRING PROVIDER: Carren Rang, PA  REASON FOR REFERRAL: unprovoked DVT and PE  HPI: Jeffrey Hodge is a 26 y.o. male with past medical history of Down syndrome, asthma and sleep apnea was referred to hematology clinic for anticoagulation recommendation for unprovoked PE and DVT.  History provided by mother.  Patient was admitted from 08/27/2021 to 08/29/2021 at [facility] for left calf pain for 3 days.  Venous Doppler showed left popliteal DVT extending into the tibial and peroneal cough points.  CTA chest showed lobar pulmonary emboli bilaterally with no right heart strain.  He was started on IV heparin and transition to Eliquis on discharge.  Denies any prior personal history of clot.  Denies any family history of clot.  Denies long distance travel, recent surgery or trauma.  He is tolerating Eliquis well.  Patient denies fever, chills, nausea, vomiting, shortness of breath, cough, abdominal pain, bleeding, bowel or bladder issues.  INTERVAL HISTORY- I did my chart video with Ms. Lynnea Ferrier, mother and decision maker for the patient. She reports patient has been tolerating Eliquis well.  Denies any bleeding issues.  Denies any falls.  REVIEW OF SYSTEMS:   ROS  As per HPI. Otherwise, a complete review of systems is negative.  PAST MEDICAL HISTORY: Past Medical History:  Diagnosis Date   Asthma    Down syndrome    Sleep apnea     PAST SURGICAL HISTORY: Past Surgical  History:  Procedure Laterality Date   I & D EXTREMITY Left 06/21/2020   Procedure: IRRIGATION AND DEBRIDEMENT LEFT UPPER EXTREMITY;  Surgeon: Sheral Apley, MD;  Location: [facility] OR;  Service: Orthopedics;  Laterality: Left;   TONSILLECTOMY      FAMILY HISTORY: No family history on file.  HEALTH MAINTENANCE: Social History   Tobacco Use   Smoking status: Never    Passive exposure: Never   Smokeless tobacco: Never  Vaping Use   Vaping Use: Never used  Substance Use Topics   Alcohol use: No   Drug use: No     Allergies  Allergen Reactions   Meningococcal And Conjugated Vaccines Hives    Current Outpatient Medications  Medication Sig Dispense Refill   [START ON 11/07/2021] apixaban (ELIQUIS) 5 MG TABS tablet Take 1 tablet (5 mg total) by mouth 2 (two) times daily. 60 tablet 2   cetirizine HCl (CETIRIZINE HCL CHILDRENS ALRGY) 5 MG/5ML SOLN Take by mouth daily.     Clobetasol Propionate 0.05 % shampoo Apply topically.     fluticasone (FLONASE) 50 MCG/ACT nasal spray Place 2 sprays into both nostrils daily.  5   metFORMIN (GLUCOPHAGE) 500 MG tablet Take 500 mg by mouth 2 (two) times daily.     montelukast (SINGULAIR) 5 MG chewable tablet Chew 10 mg by mouth at bedtime.     No current facility-administered medications for this visit.    OBJECTIVE: There were no vitals filed for this visit.    There is no height or weight on file to calculate BMI.      Physical  exam not performed due to televideo visit   LAB RESULTS:  Lab Results  Component Value Date   NA 138 08/29/2021   K 4.0 08/29/2021   CL 104 08/29/2021   CO2 25 08/29/2021   GLUCOSE 112 (H) 08/29/2021   BUN 14 08/29/2021   CREATININE 0.97 08/29/2021   CALCIUM 8.8 (L) 08/29/2021   PROT 7.0 08/28/2021   ALBUMIN 3.4 (L) 08/28/2021   AST 13 (L) 08/28/2021   ALT 9 08/28/2021   ALKPHOS 77 08/28/2021   BILITOT 1.0 08/28/2021   GFRNONAA >60 08/29/2021    Lab Results  Component Value Date   WBC 7.6  08/29/2021   NEUTROABS 5.3 08/27/2021   HGB 12.6 (L) 08/29/2021   HCT 38.8 (L) 08/29/2021   MCV 88.0 08/29/2021   PLT 258 08/29/2021    No results found for: "TIBC", "FERRITIN", "IRONPCTSAT"   STUDIES: No results found.  ASSESSMENT AND PLAN:   Jeffrey Hodge is a 27 y.o. male with pmh of Down syndrome, asthma and sleep apnea was referred to hematology clinic for anticoagulation recommendation for unprovoked PE and DVT.  # Left popliteal DVT #Acute bilateral lobar PE, unprovoked  -Diagnosed on 08/27/2021 after left calf pain for 3 days. - He has obesity with BMI of 54 which can increase the risk of thrombosis however it is a weak factor. -Completed hypercoagulable work-up in September 2023.  Factor V Leiden, prothrombin gene mutation negative.  Protein C, protein S, Antithrombin III normal.  APLA antibodies normal.  -Continue with Eliquis 5 mg twice daily for 12 months due to high clot burden.  He is tolerating well.  Denies any bleeding.  Refill sent. I will follow-up again at the completion of 1 year of anticoagulation.  No orders of the defined types were placed in this encounter.  RTC in September 2024 for MyChart video to assess anticoagulation tolerability.  Patient expressed understanding and was in agreement with this plan. He also understands that He can call clinic at any time with any questions, concerns, or complaints.   I spent a total of 25 minutes reviewing chart data, face-to-face evaluation with the patient, counseling and coordination of care as detailed above.  Michaelyn Barter, MD   10/13/2021 2:06 PM

## 2022-02-24 ENCOUNTER — Other Ambulatory Visit: Payer: Self-pay | Admitting: Internal Medicine

## 2022-02-24 DIAGNOSIS — I2699 Other pulmonary embolism without acute cor pulmonale: Secondary | ICD-10-CM

## 2022-02-24 DIAGNOSIS — I82432 Acute embolism and thrombosis of left popliteal vein: Secondary | ICD-10-CM

## 2022-04-01 ENCOUNTER — Emergency Department: Payer: Medicaid Other

## 2022-04-01 ENCOUNTER — Other Ambulatory Visit: Payer: Self-pay

## 2022-04-01 ENCOUNTER — Encounter: Payer: Self-pay | Admitting: Intensive Care

## 2022-04-01 ENCOUNTER — Emergency Department
Admission: EM | Admit: 2022-04-01 | Discharge: 2022-04-01 | Disposition: A | Discharge status: Home / Self Care | Payer: Medicaid Other | Attending: Emergency Medicine | Admitting: Emergency Medicine

## 2022-04-01 DIAGNOSIS — R1031 Right lower quadrant pain: Secondary | ICD-10-CM

## 2022-04-01 DIAGNOSIS — K8020 Calculus of gallbladder without cholecystitis without obstruction: Secondary | ICD-10-CM | POA: Diagnosis not present

## 2022-04-01 DIAGNOSIS — R109 Unspecified abdominal pain: Secondary | ICD-10-CM

## 2022-04-01 DIAGNOSIS — Z7901 Long term (current) use of anticoagulants: Secondary | ICD-10-CM | POA: Insufficient documentation

## 2022-04-01 HISTORY — DX: Prediabetes: R73.03

## 2022-04-01 LAB — COMPREHENSIVE METABOLIC PANEL
ALT: 11 U/L (ref 0–44)
AST: 15 U/L (ref 15–41)
Albumin: 3.9 g/dL (ref 3.5–5.0)
Alkaline Phosphatase: 91 U/L (ref 38–126)
Anion gap: 7 (ref 5–15)
BUN: 19 mg/dL (ref 6–20)
CO2: 28 mmol/L (ref 22–32)
Calcium: 9 mg/dL (ref 8.9–10.3)
Chloride: 104 mmol/L (ref 98–111)
Creatinine, Ser: 0.93 mg/dL (ref 0.61–1.24)
GFR, Estimated: >60 mL/min (ref >60)
Glucose, Bld: 106 mg/dL — ABNORMAL HIGH (ref 70–99)
Potassium: 3.9 mmol/L (ref 3.5–5.1)
Sodium: 139 mmol/L (ref 135–145)
Total Bilirubin: 1.2 mg/dL (ref 0.3–1.2)
Total Protein: 7.7 g/dL (ref 6.5–8.1)

## 2022-04-01 LAB — URINALYSIS, ROUTINE W REFLEX MICROSCOPIC
Bilirubin Urine: NEGATIVE
Glucose, UA: NEGATIVE mg/dL
Hgb urine dipstick: NEGATIVE
Ketones, ur: NEGATIVE mg/dL
Leukocytes,Ua: NEGATIVE
Nitrite: NEGATIVE
Protein, ur: NEGATIVE mg/dL
Specific Gravity, Urine: 1.02 (ref 1.005–1.030)
pH: 5 (ref 5.0–8.0)

## 2022-04-01 LAB — CBC
HCT: 44.1 % (ref 39.0–52.0)
Hemoglobin: 13.8 g/dL (ref 13.0–17.0)
MCH: 28.9 pg (ref 26.0–34.0)
MCHC: 31.3 g/dL (ref 30.0–36.0)
MCV: 92.3 fL (ref 80.0–100.0)
Platelets: 247 10*3/uL (ref 150–400)
RBC: 4.78 MIL/uL (ref 4.22–5.81)
RDW: 14.6 % (ref 11.5–15.5)
WBC: 5.5 10*3/uL (ref 4.0–10.5)
nRBC: 0 % (ref 0.0–0.2)

## 2022-04-01 LAB — LIPASE, BLOOD: Lipase: 29 U/L (ref 11–51)

## 2022-04-01 NOTE — ED Notes (Signed)
Pt up to restroom to provide urine sample.

## 2022-04-01 NOTE — ED Provider Notes (Signed)
[facility] Emergency Department Provider Note     Event Date/Time   First MD Initiated Contact with Patient 04/01/22 1141     (approximate)   History   Flank Pain (right)   HPI  Jeffrey Hodge is a 27 y.o. male with a history of Down syndrome, DVT/PE on Eliquis, congenital obstruction of the UPJ, difficulty hearing and poor vision, presents to the ED, advised mother.  Patient reported 3 weeks of intermittent right-sided and flank pain.  No reports of any recent nausea, vomiting or diarrhea.  Patient does report some postprandial increase in the pain.  He was evaluated by his PCP 2 weeks ago for the same complaint, and was found to have reassuring lab workup at that time.   Physical Exam   Triage Vital Signs: ED Triage Vitals  Enc Vitals Group     BP 04/01/22 1129 112/71     Pulse Rate 04/01/22 1129 74     Resp 04/01/22 1129 20     Temp 04/01/22 1129 97.9 F (36.6 C)     Temp Source 04/01/22 1129 Axillary     SpO2 04/01/22 1129 95 %     Weight 04/01/22 1126 272 lb (123.4 kg)     Height 04/01/22 1126 4\' 9"  (1.448 m)     Head Circumference --      Peak Flow --      Pain Score --      Pain Loc --      Pain Edu? --      Excl. in [place]? --     Most recent vital signs: Vitals:   04/01/22 1129  BP: 112/71  Pulse: 74  Resp: 20  Temp: 97.9 F (36.6 C)  SpO2: 95%    General Awake, no distress. NAD HEENT NCAT. PERRL. EOMI. No rhinorrhea. Mucous membranes are moist.  CV:  Good peripheral perfusion. RRR RESP:  Normal effort. CTA ABD:  No distention. Soft, mildly tender to palp over the right flank/lateral abdomen. Normal bowel sounds noted   ED Results / Procedures / Treatments   Labs (all labs ordered are listed, but only abnormal results are displayed) Labs Reviewed  COMPREHENSIVE METABOLIC PANEL - Abnormal; Notable for the following components:      Result Value   Glucose, Bld 106 (*)    All other components within normal limits   URINALYSIS, ROUTINE W REFLEX MICROSCOPIC - Abnormal; Notable for the following components:   Color, Urine YELLOW (*)    APPearance HAZY (*)    All other components within normal limits  LIPASE, BLOOD  CBC   EKG   RADIOLOGY  I personally viewed and evaluated these images as part of my medical decision making, as well as reviewing the written report by the radiologist.  ED Provider Interpretation: no acute findings  CT Renal Stone Study  Result Date: 04/01/2022 CLINICAL DATA:  Abdominal/flank pain, stone suspected Patient reports right-sided pain for 3 weeks, progressive. Patient reports pain is worse after eating. EXAM: CT ABDOMEN AND PELVIS WITHOUT CONTRAST TECHNIQUE: Multidetector CT imaging of the abdomen and pelvis was performed following the standard protocol without IV contrast. RADIATION DOSE REDUCTION: This exam was performed according to the departmental dose-optimization program which includes automated exposure control, adjustment of the mA and/or kV according to patient size and/or use of iterative reconstruction technique. COMPARISON:  CT 12/14/2016 FINDINGS: Lower chest: Breathing motion artifact. 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 No pleural effusion or acute airspace disease.  Hepatobiliary: The liver is enlarged spanning 21 cm cranial caudal dimension. Diffuse hepatic steatosis. No evidence of focal hepatic lesion on this unenhanced exam. Suspected small gallstones in the fundus. No pericholecystic inflammation or abnormal gallbladder distension. No biliary dilatation. Pancreas: Fatty atrophy No ductal dilatation or inflammation. Spleen: Normal in size without focal abnormality. Adrenals/Urinary Tract: Normal adrenal glands. Resolution of left hydronephrosis from prior exam. Mild prominence of the left ureter persists but is less prominent than on prior exam. Upper pole calyceal dilatation versus cyst in the upper kidney, suboptimally assessed on this unenhanced exam. No  specific imaging follow-up is needed. There is mild thinning of the left renal parenchyma. No right hydronephrosis. No urolithiasis. Allowing for motion artifact through the urinary bladder, no bladder wall thickening or stone. Stomach/Bowel: Detailed bowel assessment is limited in the absence of enteric contrast as well as motion artifact. Patulous distal esophagus. Ingested material distends the stomach. The duodenum likely crosses the midline, although is not well-defined due to motion. Majority of the small bowel is located in the right abdomen. The cecum is again noted to be in the left mid abdomen, unchanged from prior exam. The appendix is not confidently visualized. There is mild transverse and sigmoid colonic redundancy. Small to moderate colonic stool burden. No bowel obstruction, inflammation or abnormal distention. Vascular/Lymphatic: Normal caliber abdominal aorta. No enlarged lymph nodes in the abdomen or pelvis. Reproductive: Prostate is unremarkable. Other: Prior ventral abdominal wall hernia repair. No ascites, free air or focal fluid collection. 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 There are no acute or suspicious osseous abnormalities. IMPRESSION: 1. No renal calculi or obstructive uropathy. 2. Resolution of left hydronephrosis from prior exam. Mild prominence of the left ureter persists but is less prominent than on prior exam. 3. Hepatomegaly and hepatic steatosis. 4. Suspected cholelithiasis without CT findings of acute cholecystitis. 5. 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 Electronically Signed   By: Keith Rake M.D.   On: 04/01/2022 12:36     PROCEDURES:  Critical Care performed: No  Procedures   MEDICATIONS ORDERED IN ED: Medications - No data to display   IMPRESSION / MDM / [place] / ED COURSE  I reviewed the triage vital signs and the  nursing notes.                              Differential diagnosis includes, but is not limited to, acute appendicitis, renal colic, testicular torsion, urinary tract infection/pyelonephritis, prostatitis,  epididymitis, diverticulitis, small bowel obstruction or ileus, colitis, abdominal aortic aneurysm, gastroenteritis, hernia, etc.  Patient's presentation is most consistent with acute complicated illness / injury requiring diagnostic workup.  Patient's diagnosis is consistent with intermittent abdominal pain for the last 3 to 4 weeks.  Patient with some postprandial pain as well.  His labs are reassuring and his exam is overall without signs of acute abdominal process.  While the appendix is not visualized well on this noncontrast study, patient does not have any evidence of any renal stones or hydronephrosis.  He does have evidence of gallstones without evidence of acute cholecystitis.  Patient is to follow up with his primary provider as discussed as needed or otherwise directed. Patient is given ED precautions to return to the ED for any worsening or new symptoms.  FINAL CLINICAL IMPRESSION(S) / ED DIAGNOSES   Final diagnoses:  Right  lower quadrant abdominal pain  Flank pain  Gallstones     Rx / DC Orders   ED Discharge Orders     None        Note:  This document was prepared using Dragon voice recognition software and may include unintentional dictation errors.    Melvenia Needles, PA-C 04/01/22 Ward Chatters    Blake Divine, MD 04/02/22 1126

## 2022-04-01 NOTE — Discharge Instructions (Addendum)
Jeffrey Hodge has a normal exam, CT, and labs today. No signs of kidney stones or infection. There are gallstones noted. He should follow-up with is primary provider or return if needed.

## 2022-04-01 NOTE — ED Triage Notes (Addendum)
Patient c/o right side pain X3 weeks that has gradually gotten worse. Denies trouble urinating. Denies N/V/D. Reports pain is worse after eating.   Mom reports he saw his PCP and they did blood work that all came back unremarkable.   Takes eliquis daily for blood clots

## 2022-04-20 ENCOUNTER — Other Ambulatory Visit: Payer: Self-pay | Admitting: Surgery

## 2022-04-20 DIAGNOSIS — K8050 Calculus of bile duct without cholangitis or cholecystitis without obstruction: Secondary | ICD-10-CM

## 2022-04-24 ENCOUNTER — Ambulatory Visit: Payer: Medicaid Other

## 2022-04-26 ENCOUNTER — Ambulatory Visit
Admission: RE | Admit: 2022-04-26 | Discharge: 2022-04-26 | Disposition: A | Discharge status: Home / Self Care | Payer: Medicaid Other | Source: Ambulatory Visit | Attending: Surgery | Admitting: Surgery

## 2022-04-26 DIAGNOSIS — K8050 Calculus of bile duct without cholangitis or cholecystitis without obstruction: Secondary | ICD-10-CM | POA: Diagnosis not present

## 2022-04-28 ENCOUNTER — Other Ambulatory Visit: Payer: Self-pay | Admitting: Surgery

## 2022-04-28 DIAGNOSIS — K8050 Calculus of bile duct without cholangitis or cholecystitis without obstruction: Secondary | ICD-10-CM

## 2022-05-02 ENCOUNTER — Ambulatory Visit
Admission: RE | Admit: 2022-05-02 | Discharge: 2022-05-02 | Disposition: A | Discharge status: Home / Self Care | Payer: Medicaid Other | Source: Ambulatory Visit | Attending: Surgery | Admitting: Surgery

## 2022-05-02 DIAGNOSIS — K8050 Calculus of bile duct without cholangitis or cholecystitis without obstruction: Secondary | ICD-10-CM | POA: Insufficient documentation

## 2022-05-02 MED ORDER — TECHNETIUM TC 99M MEBROFENIN IV KIT
5.1800 | PACK | Freq: Once | INTRAVENOUS | Status: AC | PRN
  Administered 2022-05-02: 5.18 via INTRAVENOUS

## 2022-05-07 ENCOUNTER — Encounter: Payer: Self-pay | Admitting: Internal Medicine

## 2022-05-08 ENCOUNTER — Other Ambulatory Visit: Payer: Self-pay | Admitting: *Deleted

## 2022-05-08 DIAGNOSIS — I2699 Other pulmonary embolism without acute cor pulmonale: Secondary | ICD-10-CM

## 2022-05-08 DIAGNOSIS — I82432 Acute embolism and thrombosis of left popliteal vein: Secondary | ICD-10-CM

## 2022-05-08 MED ORDER — APIXABAN 5 MG PO TABS: 5.0000 mg | ORAL_TABLET | Freq: Two times a day (BID) | ORAL | 4 refills | Status: AC

## 2022-05-08 NOTE — Telephone Encounter (Signed)
Please refill for 4 months. After that he can stop Eliquis. Thanks.

## 2022-05-23 ENCOUNTER — Encounter: Payer: Self-pay | Admitting: Internal Medicine

## 2022-05-23 NOTE — Telephone Encounter (Signed)
Looks like next apt is video in Sept. Please advise

## 2022-05-23 NOTE — Telephone Encounter (Signed)
Informed mother, schedulers can someone please call mother at [phone] to schedule labs and see Dr. Mervyn Skeeters, thanks

## 2022-05-26 ENCOUNTER — Other Ambulatory Visit: Payer: Self-pay | Admitting: *Deleted

## 2022-05-26 DIAGNOSIS — I2699 Other pulmonary embolism without acute cor pulmonale: Secondary | ICD-10-CM

## 2022-05-30 ENCOUNTER — Encounter: Payer: Self-pay | Admitting: Internal Medicine

## 2022-05-30 ENCOUNTER — Inpatient Hospital Stay: Payer: Medicaid Other | Attending: Internal Medicine

## 2022-05-30 ENCOUNTER — Inpatient Hospital Stay: Payer: Medicaid Other | Admitting: Internal Medicine

## 2022-05-30 VITALS — BP 99/41 | HR 68 | Temp 97.1°F | Wt 275.3 lb

## 2022-05-30 DIAGNOSIS — Z7901 Long term (current) use of anticoagulants: Secondary | ICD-10-CM | POA: Diagnosis not present

## 2022-05-30 DIAGNOSIS — Z86718 Personal history of other venous thrombosis and embolism: Secondary | ICD-10-CM | POA: Insufficient documentation

## 2022-05-30 DIAGNOSIS — Q909 Down syndrome, unspecified: Secondary | ICD-10-CM | POA: Diagnosis not present

## 2022-05-30 DIAGNOSIS — J45909 Unspecified asthma, uncomplicated: Secondary | ICD-10-CM | POA: Diagnosis not present

## 2022-05-30 DIAGNOSIS — I2699 Other pulmonary embolism without acute cor pulmonale: Secondary | ICD-10-CM

## 2022-05-30 DIAGNOSIS — Z86711 Personal history of pulmonary embolism: Secondary | ICD-10-CM | POA: Insufficient documentation

## 2022-05-30 DIAGNOSIS — I82432 Acute embolism and thrombosis of left popliteal vein: Secondary | ICD-10-CM

## 2022-05-30 LAB — CMP (CANCER CENTER ONLY)
ALT: 12 U/L (ref 0–44)
AST: 15 U/L (ref 15–41)
Albumin: 3.8 g/dL (ref 3.5–5.0)
Alkaline Phosphatase: 90 U/L (ref 38–126)
Anion gap: 7 (ref 5–15)
BUN: 16 mg/dL (ref 6–20)
CO2: 24 mmol/L (ref 22–32)
Calcium: 9 mg/dL (ref 8.9–10.3)
Chloride: 106 mmol/L (ref 98–111)
Creatinine: 0.95 mg/dL (ref 0.61–1.24)
GFR, Estimated: >60 mL/min (ref >60)
Glucose, Bld: 105 mg/dL — ABNORMAL HIGH (ref 70–99)
Potassium: 4 mmol/L (ref 3.5–5.1)
Sodium: 137 mmol/L (ref 135–145)
Total Bilirubin: 1 mg/dL (ref 0.3–1.2)
Total Protein: 7.5 g/dL (ref 6.5–8.1)

## 2022-05-30 LAB — CBC WITH DIFFERENTIAL/PLATELET
Abs Immature Granulocytes: 0.02 10*3/uL (ref 0.00–0.07)
Basophils Absolute: 0.1 10*3/uL (ref 0.0–0.1)
Basophils Relative: 2 %
Eosinophils Absolute: 0.1 10*3/uL (ref 0.0–0.5)
Eosinophils Relative: 1 %
HCT: 41.6 % (ref 39.0–52.0)
Hemoglobin: 13.4 g/dL (ref 13.0–17.0)
Immature Granulocytes: 0 %
Lymphocytes Relative: 25 %
Lymphs Abs: 1.5 10*3/uL (ref 0.7–4.0)
MCH: 29.1 pg (ref 26.0–34.0)
MCHC: 32.2 g/dL (ref 30.0–36.0)
MCV: 90.4 fL (ref 80.0–100.0)
Monocytes Absolute: 0.4 10*3/uL (ref 0.1–1.0)
Monocytes Relative: 6 %
Neutro Abs: 4 10*3/uL (ref 1.7–7.7)
Neutrophils Relative %: 66 %
Platelets: 254 10*3/uL (ref 150–400)
RBC: 4.6 MIL/uL (ref 4.22–5.81)
RDW: 14.6 % (ref 11.5–15.5)
WBC: 6 10*3/uL (ref 4.0–10.5)
nRBC: 0 % (ref 0.0–0.2)

## 2022-05-30 NOTE — Progress Notes (Signed)
Patient's mother states that he wakes up in the morning with blood around his mouth and all in the CPAP Machine that he uses.

## 2022-05-30 NOTE — Progress Notes (Addendum)
[facility]  Telephone:([phone] [phone]   HPI: Jeffrey Hodge is a 27 y.o. male with past medical history of Down syndrome, asthma and sleep apnea was referred to hematology clinic for anticoagulation recommendation for unprovoked PE and DVT.  History provided by mother.  Patient was admitted from 08/27/2021 to 08/29/2021 at [facility] for left calf pain for 3 days.  Venous Doppler showed left popliteal DVT extending into the tibial and peroneal cough points.  CTA chest showed lobar pulmonary emboli bilaterally with no right heart strain.  He was started on IV heparin and transition to Eliquis on discharge.  Denies any prior personal history of clot.  Denies any family history of clot.  Denies long distance travel, recent surgery or trauma.  He is tolerating Eliquis well.  Patient denies fever, chills, nausea, vomiting, shortness of breath, cough, abdominal pain, bleeding, bowel or bladder issues.  INTERVAL HISTORY- Patient was seen today accompanied by mother to follow-up on bleeding  Patient was having dried blood in the mouth while waking up in the morning.  We stopped Eliquis last week.  Since then the bleeding has resolved.  REVIEW OF SYSTEMS:   ROS  As per HPI. Otherwise, a complete review of systems is negative.  PAST MEDICAL HISTORY: Past Medical History:  Diagnosis Date   Asthma    Down syndrome    Pre-diabetes    Sleep apnea     PAST SURGICAL HISTORY: Past Surgical History:  Procedure Laterality Date   I & D EXTREMITY Left 06/21/2020   Procedure: IRRIGATION AND DEBRIDEMENT LEFT UPPER EXTREMITY;  Surgeon: Sheral Apley, MD;  Location: [facility] OR;  Service: Orthopedics;  Laterality: Left;   TONSILLECTOMY      FAMILY HISTORY: History reviewed. No pertinent family history.  HEALTH MAINTENANCE: Social History   Tobacco Use   Smoking status: Never    Passive exposure: Never   Smokeless tobacco: Never  Vaping Use   Vaping Use:  Never used  Substance Use Topics   Alcohol use: No   Drug use: No     Allergies  Allergen Reactions   Meningococcal And Conjugated Vaccines Hives   Latex     Current Outpatient Medications  Medication Sig Dispense Refill   apixaban (ELIQUIS) 5 MG TABS tablet Take 1 tablet (5 mg total) by mouth 2 (two) times daily. 60 tablet 4   Clobetasol Propionate 0.05 % shampoo Apply topically.     fluticasone (FLONASE) 50 MCG/ACT nasal spray Place 2 sprays into both nostrils daily.  5   metFORMIN (GLUCOPHAGE) 500 MG tablet Take 500 mg by mouth 2 (two) times daily.     montelukast (SINGULAIR) 5 MG chewable tablet Chew 10 mg by mouth at bedtime.     pantoprazole (PROTONIX) 20 MG tablet Take by mouth.     topiramate (TOPAMAX) 25 MG tablet Take by mouth.     cetirizine HCl (CETIRIZINE HCL CHILDRENS ALRGY) 5 MG/5ML SOLN Take by mouth daily.     No current facility-administered medications for this visit.    OBJECTIVE: Vitals:   05/30/22 1456  BP: (!) 99/41  Pulse: 68  Temp: (!) 97.1 F (36.2 C)  SpO2: 99%      Body mass index is 59.57 kg/m.      Physical exam not performed due to televideo visit   LAB RESULTS:  Lab Results  Component Value Date   NA 137 05/30/2022   K 4.0 05/30/2022   CL 106 05/30/2022   CO2  24 05/30/2022   GLUCOSE 105 (H) 05/30/2022   BUN 16 05/30/2022   CREATININE 0.95 05/30/2022   CALCIUM 9.0 05/30/2022   PROT 7.5 05/30/2022   ALBUMIN 3.8 05/30/2022   AST 15 05/30/2022   ALT 12 05/30/2022   ALKPHOS 90 05/30/2022   BILITOT 1.0 05/30/2022   GFRNONAA >60 05/30/2022    Lab Results  Component Value Date   WBC 6.0 05/30/2022   NEUTROABS 4.0 05/30/2022   HGB 13.4 05/30/2022   HCT 41.6 05/30/2022   MCV 90.4 05/30/2022   PLT 254 05/30/2022    No results found for: "TIBC", "FERRITIN", "IRONPCTSAT"   STUDIES: NM Hepato W/EF  Result Date: 05/02/2022 CLINICAL DATA:  Two months of abdominal pain. EXAM: NUCLEAR MEDICINE HEPATOBILIARY IMAGING WITH  GALLBLADDER EF TECHNIQUE: Sequential images of the abdomen were obtained out to 60 minutes following intravenous administration of radiopharmaceutical. After oral ingestion of Ensure, gallbladder ejection fraction was determined. At 60 min, normal ejection fraction is greater than 33%. RADIOPHARMACEUTICALS:  5.18 mCi Tc-23m  Choletec IV COMPARISON:  Ultrasound April 26, 2022 FINDINGS: Prompt uptake and biliary excretion of activity by the liver is seen. Gallbladder activity is visualized, consistent with patency of cystic duct. Biliary activity passes into small bowel, consistent with patent common bile duct. Calculated gallbladder ejection fraction is 31%. (Normal gallbladder ejection fraction with Ensure is greater than 33%.) IMPRESSION: Reduced gallbladder ejection fraction as can be seen with chronic cholecystitis/biliary dyskinesia. Electronically Signed   By: Maudry Mayhew M.D.   On: 05/02/2022 16:36    ASSESSMENT AND PLAN:   Jeffrey Hodge is a 27 y.o. male with pmh of Down syndrome, asthma and sleep apnea was referred to hematology clinic for anticoagulation recommendation for unprovoked PE and DVT.  # Left popliteal DVT #Acute bilateral lobar PE, unprovoked  -Diagnosed on 08/27/2021 after left calf pain for 3 days.  -Completed hypercoagulable work-up in September 2023.  Factor V Leiden, prothrombin gene mutation negative.  Protein C, protein S, Antithrombin III normal.  APLA antibodies normal.  -He has completed 9 months of anticoagulation with Eliquis.  Last week, was having dried blood in the mouth in the mornings.  He stopped his Eliquis and the bleeding has resolved.  CBC and CMP reviewed and was unremarkable.  Considering his hypercoag workup was negative, bleeding issues and this was the first event for the blood clot we will discontinue anticoagulation.  I discussed with the mother about the high risk of recurrence for the second event and to be aware about symptoms such as shortness of  breath, leg swelling and calf pain.  If there is a second recurrent event, at that time he will require lifelong anticoagulation.  RTC as needed  Patient expressed understanding and was in agreement with this plan. He also understands that He can call clinic at any time with any questions, concerns, or complaints.   I spent a total of 25 minutes reviewing chart data, face-to-face evaluation with the patient, counseling and coordination of care as detailed above.  Michaelyn Barter, MD   05/30/2022 3:51 PM

## 2022-07-21 ENCOUNTER — Encounter: Payer: Self-pay | Admitting: Primary Care

## 2022-07-21 ENCOUNTER — Telehealth: Payer: Self-pay

## 2022-07-21 ENCOUNTER — Ambulatory Visit (INDEPENDENT_AMBULATORY_CARE_PROVIDER_SITE_OTHER): Payer: MEDICAID | Admitting: Primary Care

## 2022-07-21 ENCOUNTER — Institutional Professional Consult (permissible substitution) (HOSPITAL_BASED_OUTPATIENT_CLINIC_OR_DEPARTMENT_OTHER): Payer: Medicaid Other | Admitting: Pulmonary Disease

## 2022-07-21 VITALS — BP 122/68 | HR 99 | Temp 97.6°F | Ht <= 58 in | Wt 280.6 lb

## 2022-07-21 DIAGNOSIS — G4733 Obstructive sleep apnea (adult) (pediatric): Secondary | ICD-10-CM

## 2022-07-21 NOTE — Telephone Encounter (Signed)
Spoke to [place] with [employer] and requested download and last sleep study.

## 2022-07-21 NOTE — Assessment & Plan Note (Addendum)
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 Completed 9 months of anticoagulation with Eliquis, followed by hematology/oncology. No acute respiratory complaints.

## 2022-07-21 NOTE — Assessment & Plan Note (Signed)
-   Long standing hx sleep apnea, dx as a child. He has been on CPAP for 20 years. Current pressure 12-13cm h20. Needs order for new CPAP machine-current one is >10 years. DME is [employer]. Order placed for auto CPAP 8-15cm h20 with mask of choice. Follow-up in 31-90 days.

## 2022-07-21 NOTE — Patient Instructions (Signed)
Orders: Auto CPAP 8-15cm h20 with mask of choice   Follow-up 6-8 week follow-up with Beth (virtual ok)  CPAP and BIPAP Information CPAP and BIPAP are methods that use air pressure to keep your airways open and to help you breathe well. CPAP and BIPAP use different amounts of pressure. Your health care provider will tell you whether CPAP or BIPAP would be more helpful for you. CPAP stands for "continuous positive airway pressure." With CPAP, the amount of pressure stays the same while you breathe in (inhale) and out (exhale). BIPAP stands for "bi-level positive airway pressure." With BIPAP, the amount of pressure will be higher when you inhale and lower when you exhale. This allows you to take larger breaths. CPAP or BIPAP may be used in the hospital, or your health care provider may want you to use it at home. You may need to have a sleep study before your health care provider can order a machine for you to use at home. What are the advantages? CPAP or BIPAP can be helpful if you have: Sleep apnea. Chronic obstructive pulmonary disease (COPD). Heart failure. Medical conditions that cause muscle weakness, including muscular dystrophy or amyotrophic lateral sclerosis (ALS). Other problems that cause breathing to be shallow, weak, abnormal, or difficult. CPAP and BIPAP are most commonly used for obstructive sleep apnea (OSA) to keep the airways from collapsing when the muscles relax during sleep. What are the risks? Generally, this is a safe treatment. However, problems may occur, including: Irritated skin or skin sores if the mask does not fit properly. Dry or stuffy nose or nosebleeds. Dry mouth. Feeling gassy or bloated. Sinus or lung infection if the equipment is not cleaned properly. When should CPAP or BIPAP be used? In most cases, the mask only needs to be worn during sleep. Generally, the mask needs to be worn throughout the night and during any daytime naps. People with certain  medical conditions may also need to wear the mask at other times, such as when they are awake. Follow instructions from your health care provider about when to use the machine. What happens during CPAP or BIPAP?  Both CPAP and BIPAP are provided by a small machine with a flexible plastic tube that attaches to a plastic mask that you wear. Air is blown through the mask into your nose or mouth. The amount of pressure that is used to blow the air can be adjusted on the machine. Your health care provider will set the pressure setting and help you find the best mask for you. Tips for using the mask Because the mask needs to be snug, some people feel trapped or closed-in (claustrophobic) when first using the mask. If you feel this way, you may need to get used to the mask. One way to do this is to hold the mask loosely over your nose or mouth and then gradually apply the mask more snugly. You can also gradually increase the amount of time that you use the mask. Masks are available in various types and sizes. If your mask does not fit well, talk with your health care provider about getting a different one. Some common types of masks include: Full face masks, which fit over the mouth and nose. Nasal masks, which fit over the nose. Nasal pillow or prong masks, which fit into the nostrils. If you are using a mask that fits over your nose and you tend to breathe through your mouth, a chin strap may be applied to help keep  your mouth closed. Use a skin barrier to protect your skin as told by your health care provider. Some CPAP and BIPAP machines have alarms that may sound if the mask comes off or develops a leak. If you have trouble with the mask, it is very important that you talk with your health care provider about finding a way to make the mask easier to tolerate. Do not stop using the mask. There could be a negative impact on your health if you stop using the mask. Tips for using the machine Place your CPAP  or BIPAP machine on a secure table or stand near an electrical outlet. Know where the on/off switch is on the machine. Follow instructions from your health care provider about how to set the pressure on your machine and when you should use it. Do not eat or drink while the CPAP or BIPAP machine is on. Food or fluids could get pushed into your lungs by the pressure of the CPAP or BIPAP. For home use, CPAP and BIPAP machines can be rented or purchased through home health care companies. Many different brands of machines are available. Renting a machine before purchasing may help you find out which particular machine works well for you. Your health insurance company may also decide which machine you may get. Keep the CPAP or BIPAP machine and attachments clean. Ask your health care provider for specific instructions. Check the humidifier if you have a dry stuffy nose or nosebleeds. Make sure it is working correctly. Follow these instructions at home: Take over-the-counter and prescription medicines only as told by your health care provider. Ask if you can take sinus medicine if your sinuses are blocked. Do not use any products that contain nicotine or tobacco. These products include cigarettes, chewing tobacco, and vaping devices, such as e-cigarettes. If you need help quitting, ask your health care provider. Keep all follow-up visits. This is important. Contact a health care provider if: You have redness or pressure sores on your head, face, mouth, or nose from the mask or head gear. You have trouble using the CPAP or BIPAP machine. You cannot tolerate wearing the CPAP or BIPAP mask. Someone tells you that you snore even when wearing your CPAP or BIPAP. Get help right away if: You have trouble breathing. You feel confused. Summary CPAP and BIPAP are methods that use air pressure to keep your airways open and to help you breathe well. If you have trouble with the mask, it is very important that you  talk with your health care provider about finding a way to make the mask easier to tolerate. Do not stop using the mask. There could be a negative impact to your health if you stop using the mask. Follow instructions from your health care provider about when to use the machine. This information is not intended to replace advice given to you by your health care provider. Make sure you discuss any questions you have with your health care provider. Document Revised: 07/28/2020 Document Reviewed: 11/28/2019 Elsevier Patient Education  2023 [employer].

## 2022-07-21 NOTE — Progress Notes (Signed)
Reviewed and agree with assessment/plan.   Coralyn Helling, MD [facility] Pulmonary/Critical Care 07/21/2022, 5:03 PM Pager:  [phone]

## 2022-07-21 NOTE — Progress Notes (Signed)
@Patient  ID: Jeffrey Hodge, male    DOB: 10-31-1995, 27 y.o.   MRN: [id number]  Chief Complaint  Patient presents with   sleep consult    Wearing cpap nightly- pressure and mask is okay. Sleep study 2018    Referring provider: Nira Retort  HPI: 27 year old male, never smoked. PMH significant for DVT/PE, sleep apnea, hyperreactive airway, allergic rhinitis, down's syndrome.   07/21/2022 Patient presents today for sleep consult. Accompanied by his mother. Dx with sleep apnea at a very young age, he has been on CPAP for about 20 years. Using CPAP nightly, pressure set 12-13cm h20. Needs new machine, current one >10 years. Received message stating motar life has been exceeded. No issues with mask fit or pressure settings. He can sometimes get rash from mask but not experiencing one now. Patient feels he sleeps well. His mother states that he has been sleeping in more the last couple of months but is not falling asleep during the day. Typical bedtime 10-11pm. Takes 15 minutes to fall asleep. He starts his day around 10 or 11am. Epworth score 0/24. DME is [facility]   Hx unprovoked DVT/PE, completed 9 months of anticoagulation treatment. No changes in breathing.    Allergies  Allergen Reactions   Meningococcal And Conjugated Vaccines Hives   Latex     Immunization History  Administered Date(s) Administered   Influenza Inj Mdck Quad Pf 11/22/2017, 10/09/2018, 10/27/2020, 10/21/2021   Influenza,inj,Quad PF,6+ Mos 10/27/2013, 12/01/2014, 09/20/2015, 02/13/2017   Influenza-Unspecified 11/02/2013, 12/01/2014, 02/13/2017   Moderna Sars-Covid-2 Vaccination 06/26/2019, 07/24/2019   Tdap 06/17/2018    Past Medical History:  Diagnosis Date   Asthma    Down syndrome    Pre-diabetes    Sleep apnea     Tobacco History: Social History   Tobacco Use  Smoking Status Never   Passive exposure: Never  Smokeless Tobacco Never   Counseling given: Not Answered   Outpatient  Medications Prior to Visit  Medication Sig Dispense Refill   Clobetasol Propionate 0.05 % shampoo Apply topically.     fluticasone (FLONASE) 50 MCG/ACT nasal spray Place 2 sprays into both nostrils daily.  5   metFORMIN (GLUCOPHAGE) 500 MG tablet Take 500 mg by mouth 2 (two) times daily.     montelukast (SINGULAIR) 5 MG chewable tablet Chew 10 mg by mouth at bedtime.     pantoprazole (PROTONIX) 20 MG tablet Take by mouth.     apixaban (ELIQUIS) 5 MG TABS tablet Take 1 tablet (5 mg total) by mouth 2 (two) times daily. (Patient not taking: Reported on 07/21/2022) 60 tablet 4   cetirizine HCl (CETIRIZINE HCL CHILDRENS ALRGY) 5 MG/5ML SOLN Take by mouth daily.     topiramate (TOPAMAX) 25 MG tablet Take by mouth. (Patient not taking: Reported on 07/21/2022)     No facility-administered medications prior to visit.    Review of Systems  Review of Systems  Constitutional: Negative.   HENT: Negative.    Respiratory: Negative.    Cardiovascular: Negative.      Physical Exam  BP 122/68 (BP Location: Left Arm, Cuff Size: Large)   Pulse 99   Temp 97.6 F (36.4 C) (Temporal)   Ht 4\' 9"  (1.448 m)   Wt 280 lb 9.6 oz (127.3 kg)   SpO2 95%   BMI 60.72 kg/m  Physical Exam Constitutional:      General: He is not in acute distress.    Appearance: Normal appearance. He is obese. He is not ill-appearing.  HENT:     Head: Normocephalic and atraumatic.  Cardiovascular:     Rate and Rhythm: Normal rate and regular rhythm.  Pulmonary:     Effort: Pulmonary effort is normal.     Breath sounds: Normal breath sounds.  Musculoskeletal:        General: Normal range of motion.     Cervical back: Normal range of motion and neck supple.  Skin:    General: Skin is warm and dry.  Neurological:     General: No focal deficit present.     Mental Status: He is alert and oriented to person, place, and time. Mental status is at baseline.  Psychiatric:        Mood and Affect: Mood normal.        Behavior:  Behavior normal.        Thought Content: Thought content normal.        Judgment: Judgment normal.      Lab Results:  CBC    Component Value Date/Time   WBC 6.0 05/30/2022 1439   RBC 4.60 05/30/2022 1439   HGB 13.4 05/30/2022 1439   HGB 13.8 06/26/2013 1940   HCT 41.6 05/30/2022 1439   HCT 42.3 06/26/2013 1940   PLT 254 05/30/2022 1439   PLT 249 06/26/2013 1940   MCV 90.4 05/30/2022 1439   MCV 89 06/26/2013 1940   MCH 29.1 05/30/2022 1439   MCHC 32.2 05/30/2022 1439   RDW 14.6 05/30/2022 1439   RDW 15.3 (H) 06/26/2013 1940   LYMPHSABS 1.5 05/30/2022 1439   MONOABS 0.4 05/30/2022 1439   EOSABS 0.1 05/30/2022 1439   BASOSABS 0.1 05/30/2022 1439    BMET    Component Value Date/Time   NA 137 05/30/2022 1438   NA 138 06/26/2013 1940   K 4.0 05/30/2022 1438   K 3.6 06/26/2013 1940   CL 106 05/30/2022 1438   CL 106 06/26/2013 1940   CO2 24 05/30/2022 1438   CO2 26 (H) 06/26/2013 1940   GLUCOSE 105 (H) 05/30/2022 1438   GLUCOSE 101 (H) 06/26/2013 1940   BUN 16 05/30/2022 1438   BUN 10 06/26/2013 1940   CREATININE 0.95 05/30/2022 1438   CREATININE 0.70 06/26/2013 1940   CALCIUM 9.0 05/30/2022 1438   CALCIUM 9.5 06/26/2013 1940   GFRNONAA >60 05/30/2022 1438    BNP No results found for: "BNP"  ProBNP No results found for: "PROBNP"  Imaging: No results found.   Assessment & Plan:   Apnea, sleep - Long standing hx sleep apnea, dx as a child. He has been on CPAP for 20 years. Current pressure 12-13cm h20. Needs order for new CPAP machine-current one is >10 years. DME is [facility]. Order placed for auto CPAP 8-15cm h20 with mask of choice. Follow-up in 31-90 days.   Pulmonary emboli (HCC) - Unprovoked DVT/PE dx in August 2023. Completed 9 months of anticoagulation with Eliquis, followed by hematology/oncology. No acute respiratory complaints.    Glenford Bayley, NP 07/21/2022

## 2022-07-24 ENCOUNTER — Other Ambulatory Visit: Payer: Self-pay

## 2022-07-24 ENCOUNTER — Emergency Department
Admission: EM | Admit: 2022-07-24 | Discharge: 2022-07-24 | Disposition: A | Discharge status: Home / Self Care | Payer: MEDICAID | Attending: Emergency Medicine | Admitting: Emergency Medicine

## 2022-07-24 ENCOUNTER — Emergency Department: Payer: MEDICAID

## 2022-07-24 DIAGNOSIS — Z86718 Personal history of other venous thrombosis and embolism: Secondary | ICD-10-CM | POA: Diagnosis not present

## 2022-07-24 DIAGNOSIS — Z86711 Personal history of pulmonary embolism: Secondary | ICD-10-CM | POA: Insufficient documentation

## 2022-07-24 DIAGNOSIS — Z7901 Long term (current) use of anticoagulants: Secondary | ICD-10-CM | POA: Diagnosis not present

## 2022-07-24 DIAGNOSIS — M79605 Pain in left leg: Secondary | ICD-10-CM

## 2022-07-24 NOTE — Telephone Encounter (Signed)
Sleep study received and given to [facility] for review. Melissa stated that patient's machine is older. He will need to being machine into office foe download.  Order placed for new machine during office visit.

## 2022-07-24 NOTE — ED Triage Notes (Signed)
Pt to ED for left leg pain for about a week. Recently came off eliquis. Hx DVT.

## 2022-07-24 NOTE — ED Provider Notes (Signed)
[facility] Provider Note    Event Date/Time   First MD Initiated Contact with Patient 07/24/22 1301     (approximate)   History   Leg Pain   HPI  Jeffrey Hodge is a 27 y.o. male with a past medical history of DVT, pulmonary embolism, recently discontinued Eliquis 2 months ago who presents today for evaluation of left calf pain.  Mom reports that he plays video games a lot, though gets up to walk every 1-2 hours.  He has not had any chest pain or shortness of breath.  He has not had any discoloration or swelling to his leg.  Mom is concerned because of his history of DVT.  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   Airway hyperreactivity 12/10/2012          Physical Exam   Triage Vital Signs: ED Triage Vitals  Encounter Vitals Group     BP 07/24/22 1224 127/81     Systolic BP Percentile --      Diastolic BP Percentile --      Pulse Rate 07/24/22 1224 69     Resp 07/24/22 1224 20     Temp 07/24/22 1224 98.3 F (36.8 C)     Temp src --      SpO2 07/24/22 1224 96 %     Weight 07/24/22 1225 279 lb 15.8 oz (127 kg)     Height 07/24/22 1225 4\' 9"  (1.448 m)     Head Circumference --      Peak Flow --      Pain Score 07/24/22 1224 4     Pain Loc --      Pain Education --      Exclude from Growth Chart --     Most recent vital signs: Vitals:   07/24/22 1224  BP: 127/81  Pulse: 69  Resp: 20  Temp: 98.3 F (36.8 C)  SpO2: 96%     Physical Exam Vitals and nursing note reviewed.  Constitutional:      General: Awake and alert. No acute distress.    Appearance: Normal appearance. The patient is obese.  HENT:     Head: Normocephalic and atraumatic.     Mouth: Mucous membranes are moist.  Eyes:     General: PERRL. Normal EOMs        Right eye: No discharge.        Left eye: No discharge.     Conjunctiva/sclera: Conjunctivae normal.  Cardiovascular:     Rate and Rhythm: Normal rate and regular rhythm.     Pulses: Normal pulses.  Pulmonary:     Effort: Pulmonary effort is normal. No respiratory distress.     Breath sounds: Normal breath sounds.  Abdominal:     Abdomen is soft. There is no abdominal tenderness. No rebound or guarding. No distention. Musculoskeletal:        General: No swelling. Normal range of motion.     Cervical back: Normal range of  motion and neck supple.  Left calf with tenderness, no swelling.  No pitting edema.  No erythema or ecchymosis.  Normal range of motion of the ankle, knee, hip.  Normal pedal pulses.  Normal capillary refill.  Sensation intact light touch throughout. Skin:    General: Skin is warm and dry.     Capillary Refill: Capillary refill takes less than 2 seconds.     Findings: No rash.  Neurological:     Mental Status: The patient is awake and alert.      ED Results / Procedures / Treatments   Labs (all labs ordered are listed, but only abnormal results are displayed) Labs Reviewed - No data to display   EKG     RADIOLOGY     PROCEDURES:  Critical Care performed:   Procedures   MEDICATIONS ORDERED IN ED: Medications - No data to display   IMPRESSION / MDM / ASSESSMENT AND PLAN / ED COURSE  I reviewed the triage vital signs and the nursing notes.   Differential diagnosis includes, but is not limited to, DVT, muscle strain, muscle spasm.  Patient is awake and alert, hemodynamically stable and afebrile. He is neurovascularly intact.  There  are no skin changes to suggest phlegmasia cerulea dolans or cellulitis. Given his history of PE/DVT and recent discontinuation of eliquis, DVT ultrasound obtained which is negative for DVT.  Patient and his mother are reassured by these findings.  There is no tachycardia, hypoxia, chest pain, pleurisy, shortness of breath to suggest PE. Most likely musculoskeletal in etiology.  Mom and patient reassured. Discussed return precautions. Discharged in stable condition.   Patient's presentation is most consistent with acute complicated illness / injury requiring diagnostic workup.    FINAL CLINICAL IMPRESSION(S) / ED DIAGNOSES   Final diagnoses:  Left leg pain     Rx / DC Orders   ED Discharge Orders     None        Note:  This document was prepared using Dragon voice recognition software and may include unintentional dictation errors.   Keturah Shavers 07/24/22 1444    Pilar Jarvis, MD 07/24/22 [phone]

## 2022-07-24 NOTE — ED Notes (Signed)
See triage notes. Patient has had left leg pain times one week.

## 2022-07-24 NOTE — Discharge Instructions (Addendum)
Your ultrasound is  negative. Return for new, worsening, or change in symptoms or other concerns.

## 2022-09-06 ENCOUNTER — Encounter: Payer: Self-pay | Admitting: Pulmonary Disease

## 2022-09-14 ENCOUNTER — Telehealth: Payer: Medicaid Other | Admitting: Internal Medicine

## 2023-03-23 ENCOUNTER — Emergency Department
Admission: EM | Admit: 2023-03-23 | Discharge: 2023-03-23 | Disposition: A | Discharge status: Home / Self Care | Payer: MEDICAID | Attending: Emergency Medicine | Admitting: Emergency Medicine

## 2023-03-23 ENCOUNTER — Other Ambulatory Visit: Payer: Self-pay

## 2023-03-23 ENCOUNTER — Emergency Department: Payer: MEDICAID

## 2023-03-23 DIAGNOSIS — M79672 Pain in left foot: Secondary | ICD-10-CM | POA: Diagnosis present

## 2023-03-23 DIAGNOSIS — M722 Plantar fascial fibromatosis: Secondary | ICD-10-CM | POA: Insufficient documentation

## 2023-03-23 NOTE — ED Provider Notes (Signed)
 [facility] Provider Note    Event Date/Time   First MD Initiated Contact with Patient 03/23/23 [phone]     (approximate)   History   Foot Pain   HPI Jeffrey Hodge is a 28 y.o. male presenting today for left foot pain. Patient states pain to the bottom of his left foot since yesterday. He notes he did have a fall in the yard but unsure if there was any trauma to the foot. No other injuries. Guardian here with him is not aware of any trauma. He notes pain is worse when he first starts walking on it and improves with ambulation. Described as a burning pain through the arch of the foot.      Physical Exam   Triage Vital Signs: ED Triage Vitals [03/23/23 0820]  Encounter Vitals Group     BP 92/75     Systolic BP Percentile      Diastolic BP Percentile      Pulse Rate 70     Resp 18     Temp 97.9 F (36.6 C)     Temp src      SpO2 96 %     Weight      Height      Head Circumference      Peak Flow      Pain Score      Pain Loc      Pain Education      Exclude from Growth Chart     Most recent vital signs: Vitals:   03/23/23 0820  BP: 92/75  Pulse: 70  Resp: 18  Temp: 97.9 F (36.6 C)  SpO2: 96%   I have reviewed the vital signs. General:  Awake, alert, no acute distress. Head:  Normocephalic, Atraumatic. EENT:  PERRL, EOMI, Oral mucosa pink and moist, Neck is supple. Cardiovascular: Regular rate, 2+ distal pulses. Respiratory:  Normal respiratory effort, symmetrical expansion, no distress.   Extremities:  Moving all four extremities through full ROM without pain.  No tenderness to palpation throughout the entire left foot. Mild pain through the palmar arch with forced dorsiflexion of the left foot. Neuro:  Alert and oriented.  Interacting appropriately.   Skin:  Warm, dry, no rash.   Psych: Appropriate affect.    ED Results / Procedures / Treatments   Labs (all labs ordered are listed, but only abnormal results are  displayed) Labs Reviewed - No data to display   EKG    RADIOLOGY Independently interpreted xray with no acute pathology   PROCEDURES:  Critical Care performed: No  Procedures   MEDICATIONS ORDERED IN ED: Medications - No data to display   IMPRESSION / MDM / ASSESSMENT AND PLAN / ED COURSE  I reviewed the triage vital signs and the nursing notes.                              Differential diagnosis includes, but is not limited to, metatarsal fracture, plantar fascitis  Patient's presentation is most consistent with acute complicated illness / injury requiring diagnostic workup.  Patient is a 28 year old male presenting today for pain to the arch of the left foot. Non-tender to palpation throughout the foot. Mild pain over the arch with forced dorsiflexion. Pain improves with ambulation. Xray's ordered in triage show no acute osseous abnormalities. His symptoms seem most consistent with plantar fascitis. Discussed symptomatic treatment at home with tylenol as needed,  stretching exercises, and rolling golf ball or frozen plastic water bottle over the sole of the foot. Told to follow up with PCP as needed.     FINAL CLINICAL IMPRESSION(S) / ED DIAGNOSES   Final diagnoses:  Plantar fasciitis of left foot     Rx / DC Orders   ED Discharge Orders     None        Note:  This document was prepared using Dragon voice recognition software and may include unintentional dictation errors.   Janith Lima, MD 03/23/23 [phone]

## 2023-03-23 NOTE — ED Triage Notes (Signed)
 Pt to ED for left foot pain x3 days. Mother reports pt is not wanting to walk or step on it.  Reports hx blood clots. Denies leg pain, redness, swelling. Denies injuries.
